# Patient Record
Sex: Female | Born: 1975 | Hispanic: No | Marital: Married | Smoking: Never smoker
Health system: Southern US, Community
[De-identification: ages and names within clinical notes are randomized; demographics above are authoritative.]

## PROBLEM LIST (undated history)

## (undated) HISTORY — PX: CYST EXCISION: SHX5701

---

## 2005-09-11 ENCOUNTER — Ambulatory Visit (HOSPITAL_COMMUNITY): Admission: RE | Admit: 2005-09-11 | Discharge: 2005-09-11 | Payer: Self-pay | Admitting: Obstetrics & Gynecology

## 2005-12-16 ENCOUNTER — Inpatient Hospital Stay (HOSPITAL_COMMUNITY): Admission: AD | Admit: 2005-12-16 | Discharge: 2005-12-23 | Payer: Self-pay | Admitting: Obstetrics & Gynecology

## 2005-12-29 ENCOUNTER — Inpatient Hospital Stay (HOSPITAL_COMMUNITY): Admission: AD | Admit: 2005-12-29 | Discharge: 2006-01-15 | Payer: Self-pay | Admitting: Obstetrics & Gynecology

## 2006-01-13 ENCOUNTER — Encounter (INDEPENDENT_AMBULATORY_CARE_PROVIDER_SITE_OTHER): Payer: Self-pay | Admitting: *Deleted

## 2006-10-18 IMAGING — US US OB TRANSVAGINAL MODIFY
1 series · 13 of 28 positions shown · non-contrast
Comparison: none

CLINICAL DATA: Preterm labor.

[Series 1: us ob transvaginal modify · 0.35mm/px · 13 of 38 slices shown]
[im 2/38]
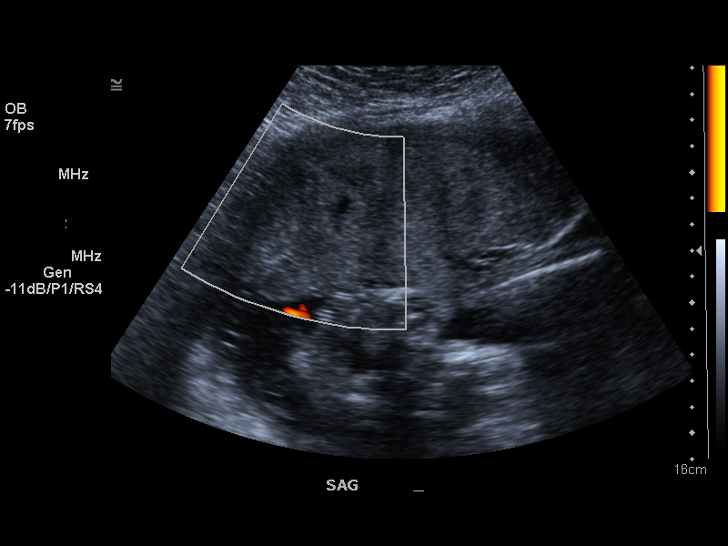
[im 5/38]
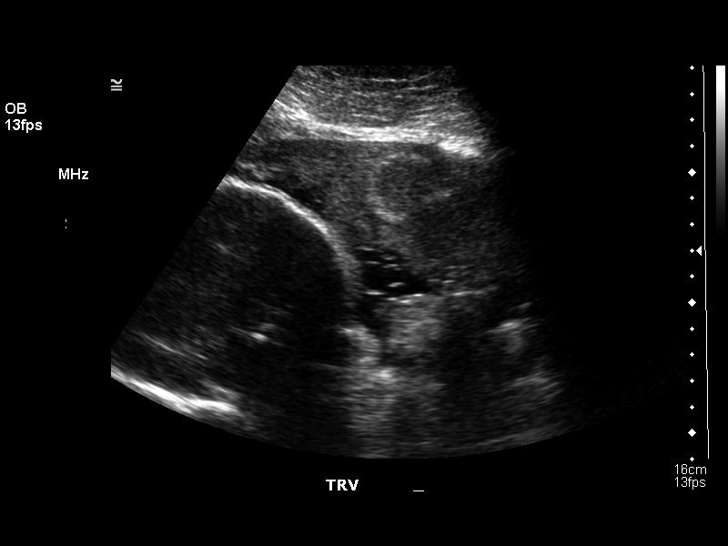
[im 7/38]
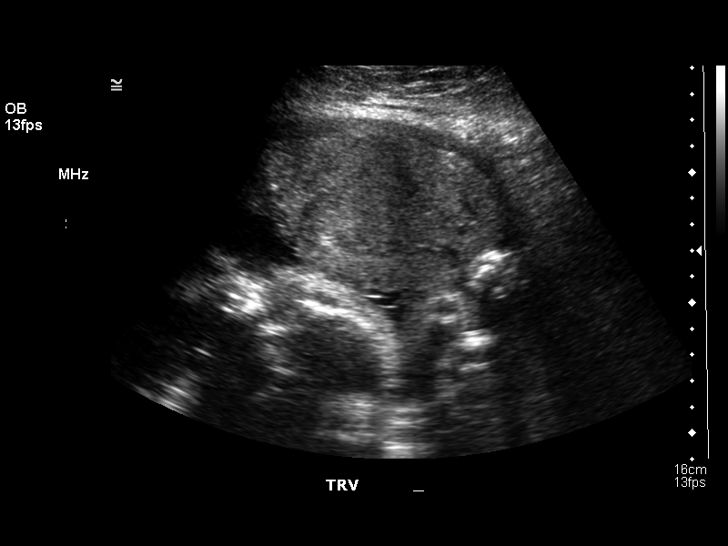
[im 10/38]
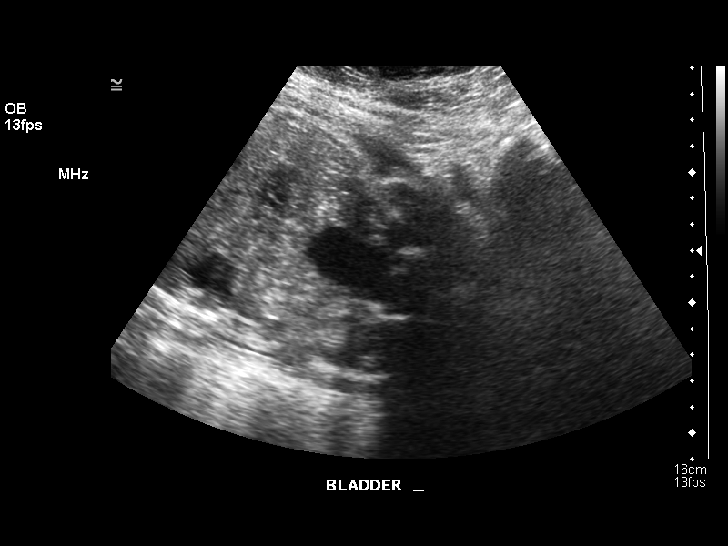
[im 13/38]
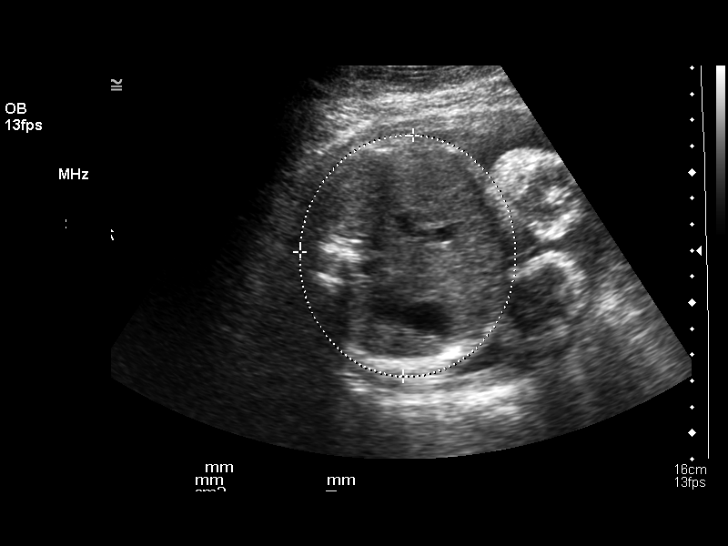
[im 16/38]
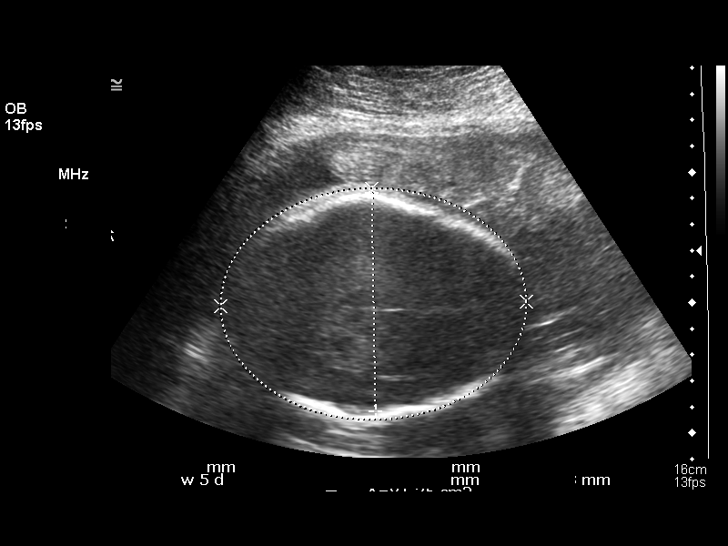
[im 20/38]
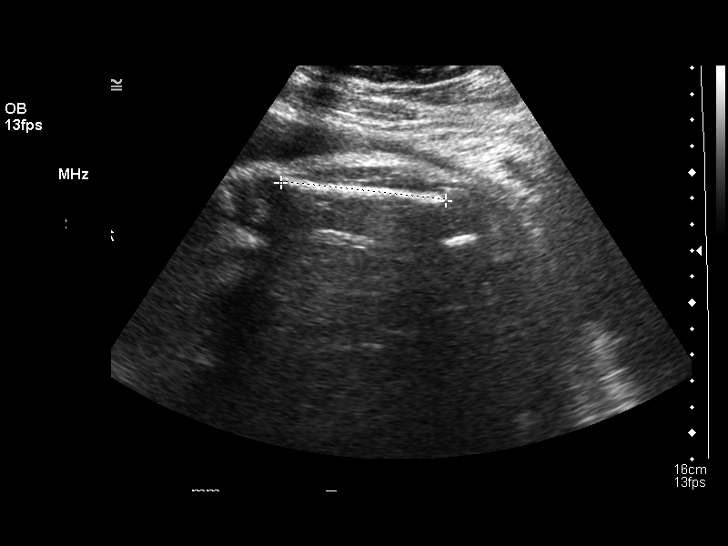
[im 22/38]
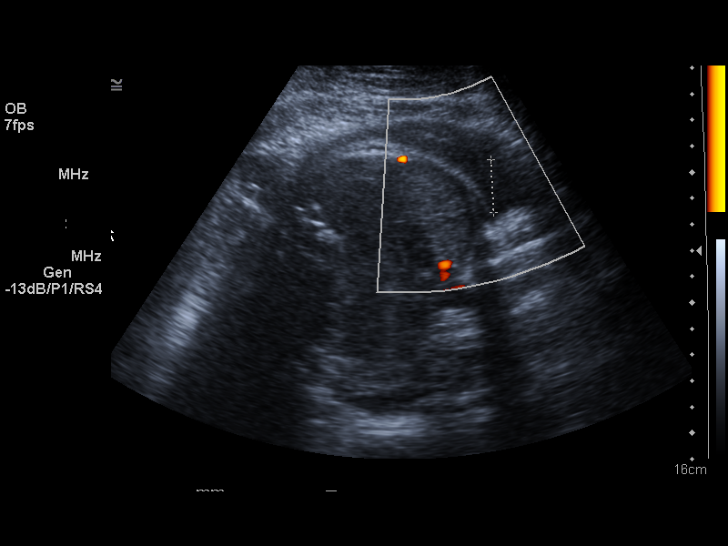
[im 25/38]
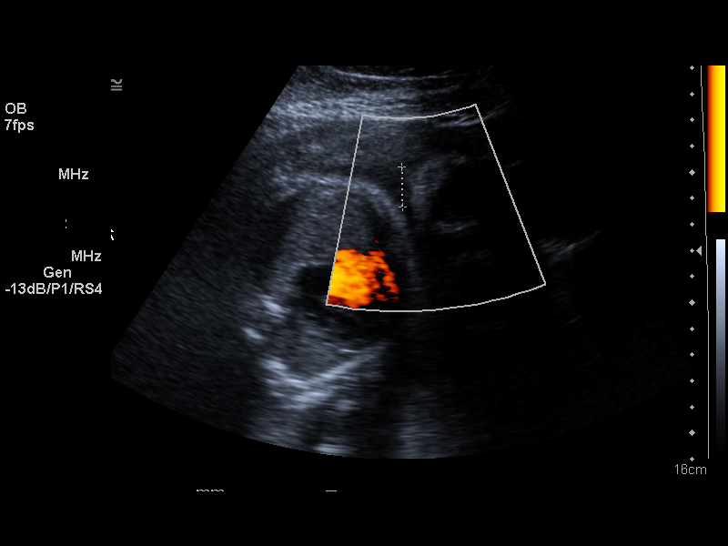
[im 28/38]
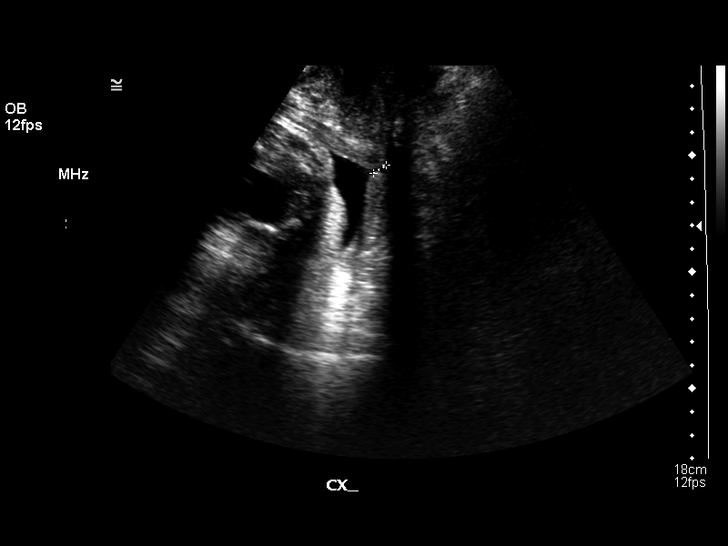
[im 31/38]
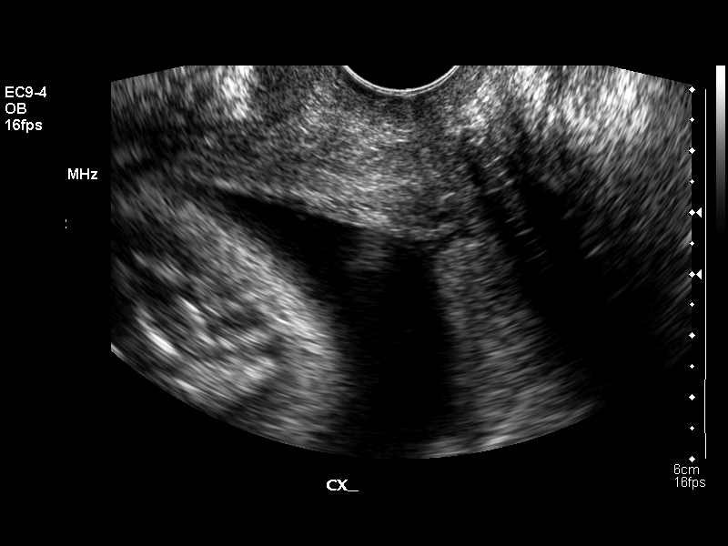
[im 33/38]
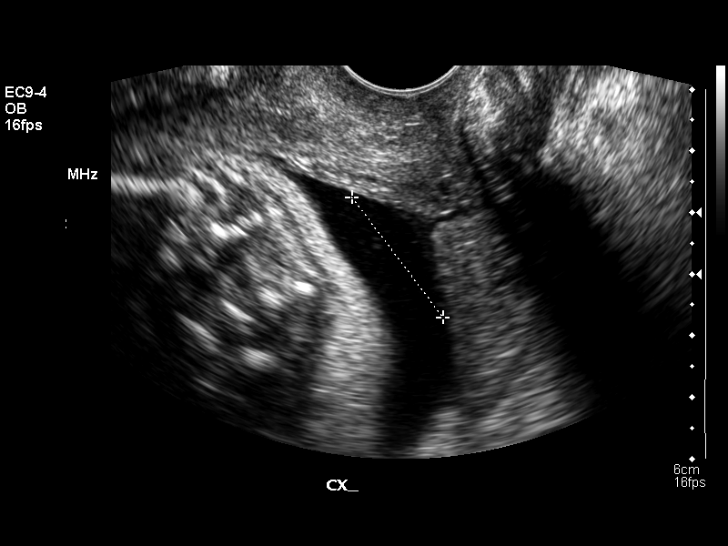
[im 36/38]
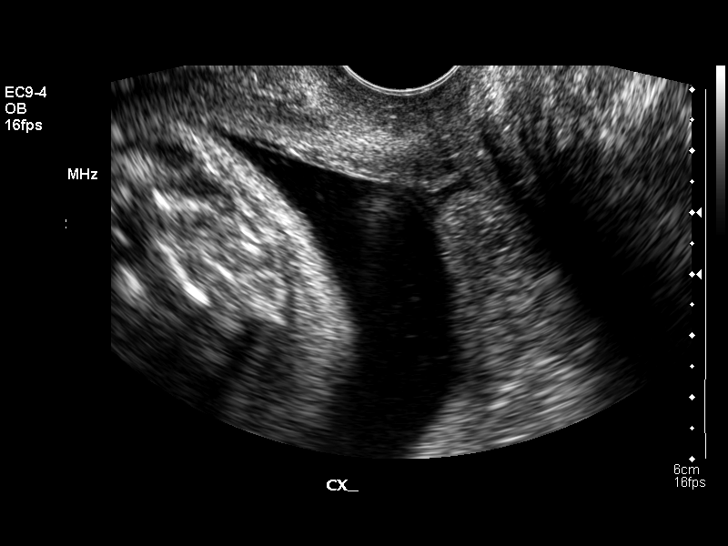

[13 of 28 positions shown; findings below may reference images not displayed]

OBSTETRICAL ULTRASOUND RE-EVALUATION AND TRANSVAGINAL OB US:
 Number of Fetuses: 1
 Heart Rate:  128
 Movement:  Yes
 Breathing:  No
 Presentation:  Frank breech
 Placental Location:  Anterior
 Grade:  II
 Previa:  No
 Amniotic Fluid (subjective):  Normal
 Amniotic Fluid (objective):  9.4 cm AFI (5th -95th%ile = 8.6 – 24.2 cm for 32 wks)

 FETAL BIOMETRY
 BPD:  8.4 cm  33 w 5 d
 HC:  32.2 cm  36 w 2 d
 AC:  27.6 cm  31 w 4 d
 FL:   6.5 cm   33 w 2 d

 Mean GA:  33 w 5 d  US EDC:  01/29/06
 Assigned GA:  32 w 2 d  Assigned EDC:  02/08/06

 EFW:  7118 g (H) 75th – 90th%ile (2544 – 8852 g) For 32 wks

 FETAL ANATOMY
 Lateral Ventricles:  Previously seen 
 Thalami/CSP:  Previously seen 
 Posterior Fossa:  Previously seen 
 Nuchal Region:  Previously seen 
 Spine:  Previously seen 
 4 Chamber Heart on Left:  Previously seen 
 Stomach on Left:  Visualized 
 3 Vessel Cord:  Previously seen 
 Cord Insertion Site:  Previously seen 
 Kidneys:  Visualized 
 Bladder:  Visualized 
 Extremities:  Previously seen 

 ADDITIONAL ANATOMY VISUALIZED:  Male genitalia.

 MATERNAL UTERINE AND ADNEXAL FINDINGS
 Cervix: 0 cm Transvaginally
IMPRESSION: 1.  Single live intrauterine gestation in breech presentation with measurements corresponding to 33 weeks 5 days estimated gestational age.  
 2.  Limited morphologic survey due to advanced gestational age.
 3.  Anterior placenta without previa.  
 4.  Normal amniotic fluid volume, 9.4 cm AFI.
 5.  Cervix appears nearly completely effaced, 6 to 7 mm in length with funneling and extension of fluid into the os.

## 2006-11-14 IMAGING — US US OB LIMITED
1 series · 14 of 22 positions shown · non-contrast
Comparison: none

CLINICAL DATA: 34 weeks estimated gestational age.  Preterm labor.  Assess fetal positioning.

[Series 1: us ob limited · 0.39mm/px · 14 of 22 slices shown]
[im 1/22]
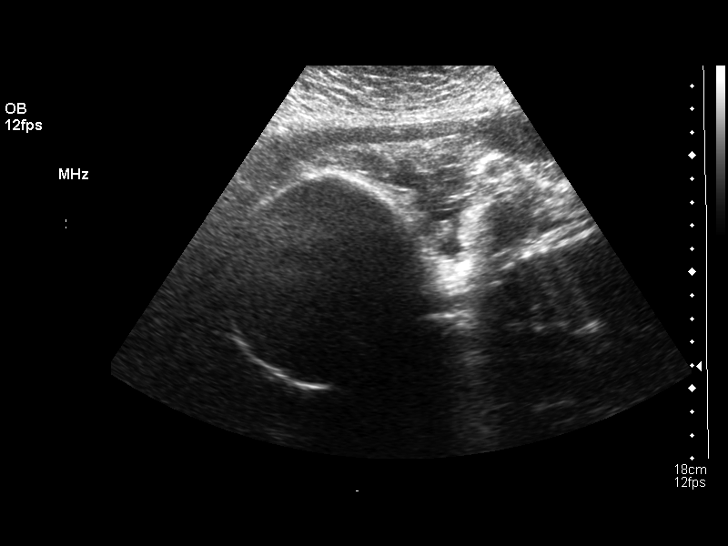
[im 3/22]
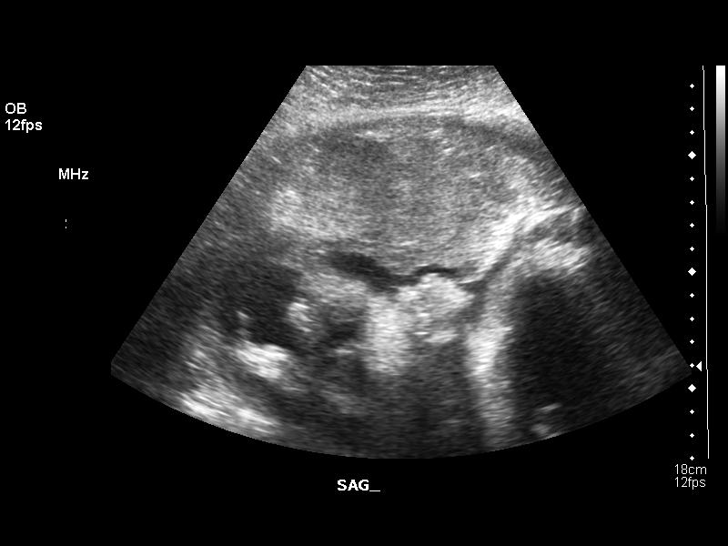
[im 4/22]
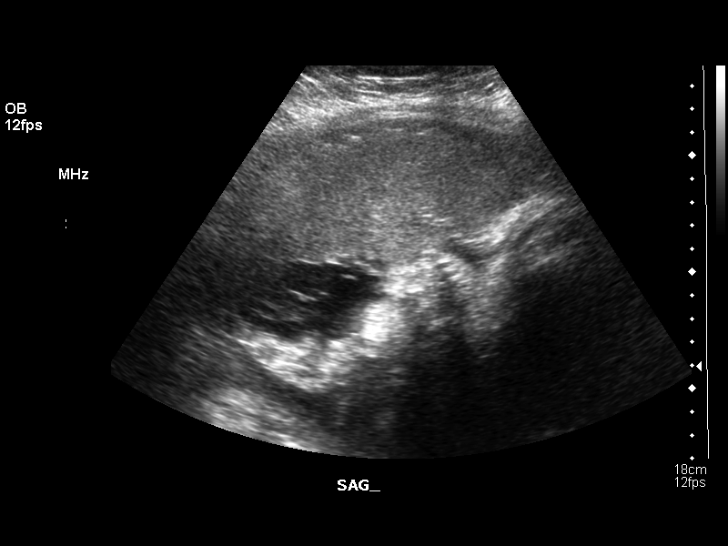
[im 6/22]
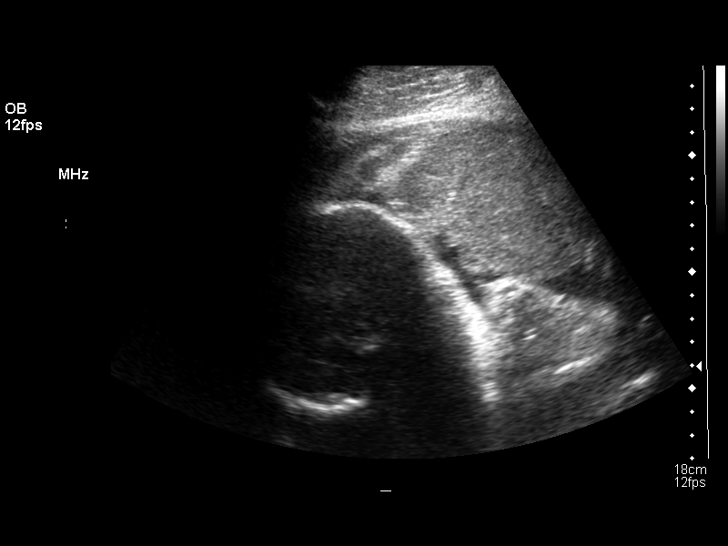
[im 8/22]
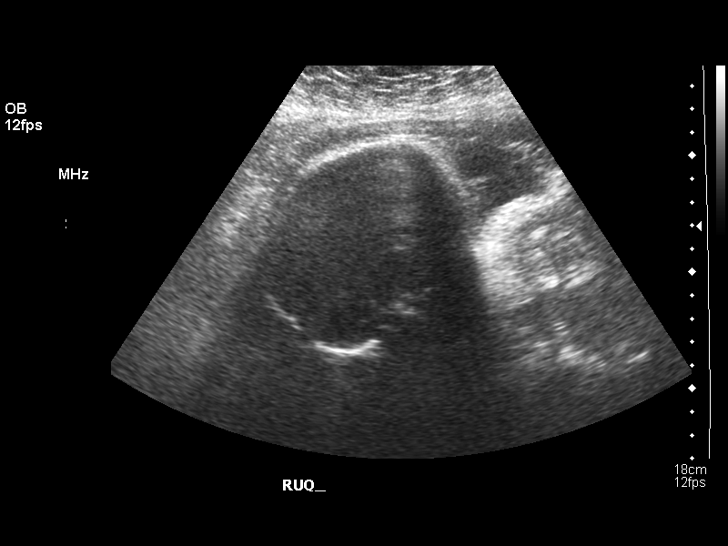
[im 9/22]
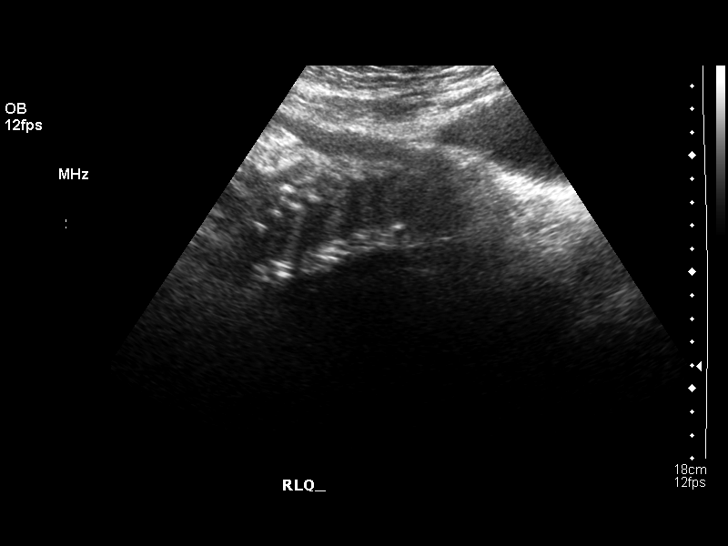
[im 11/22]
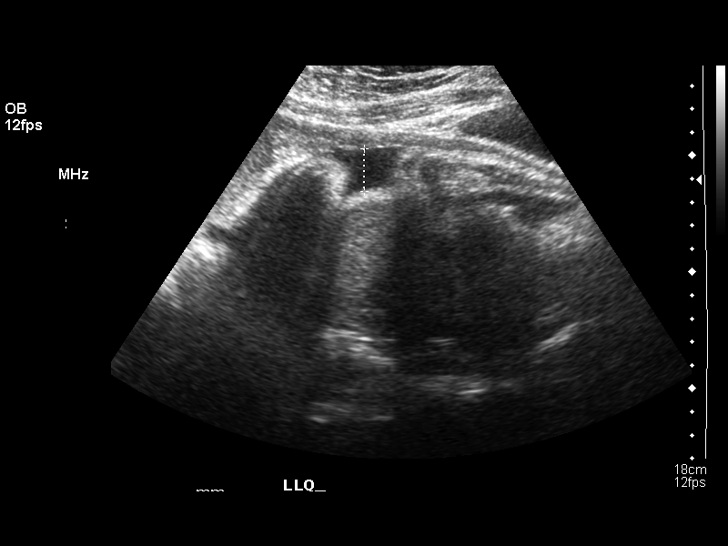
[im 12/22]
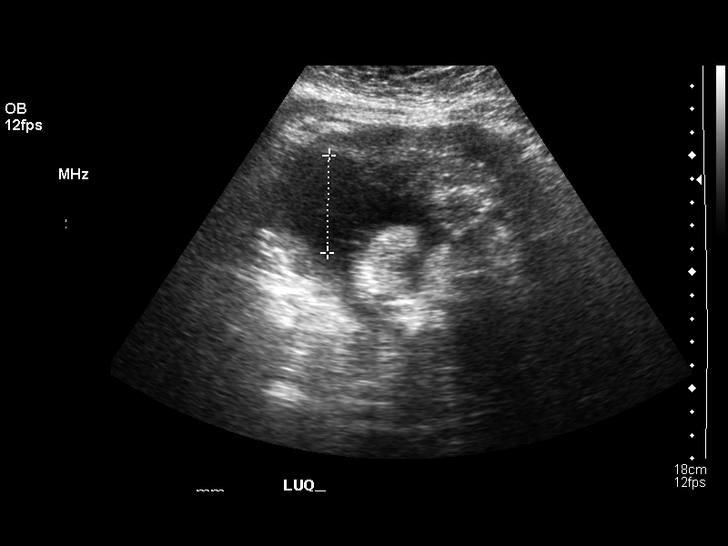
[im 14/22]
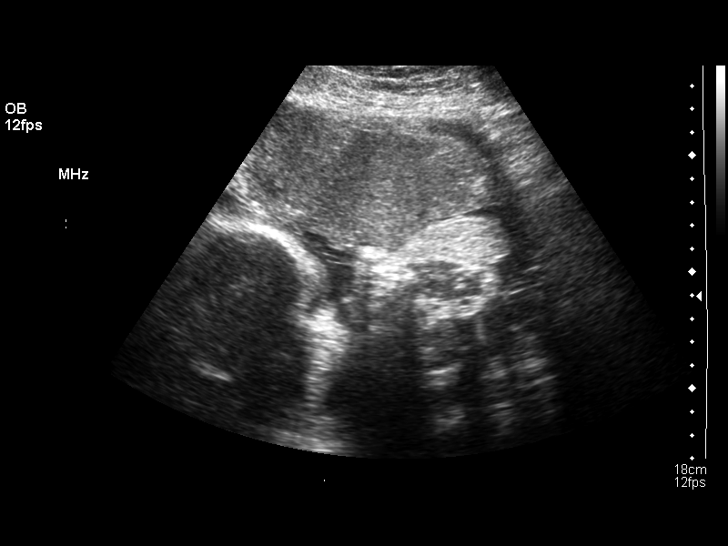
[im 15/22]
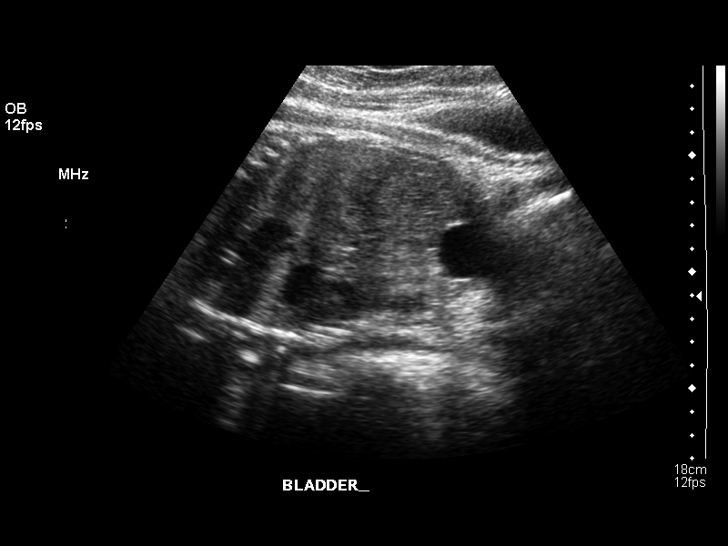
[im 17/22]
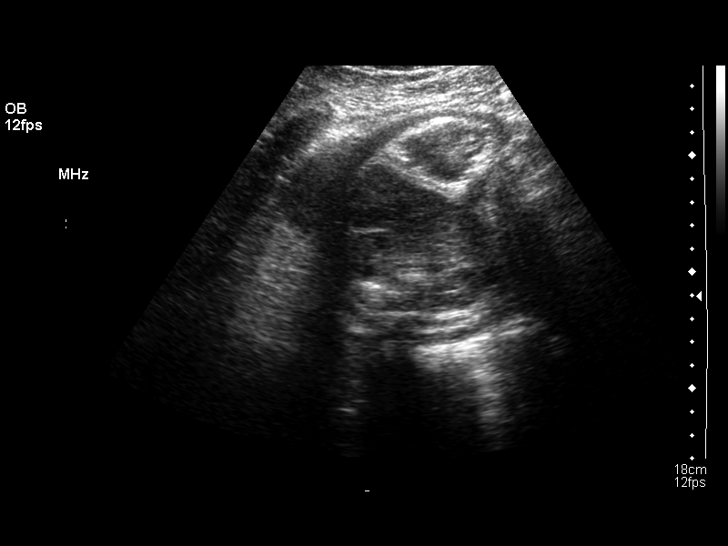
[im 19/22]
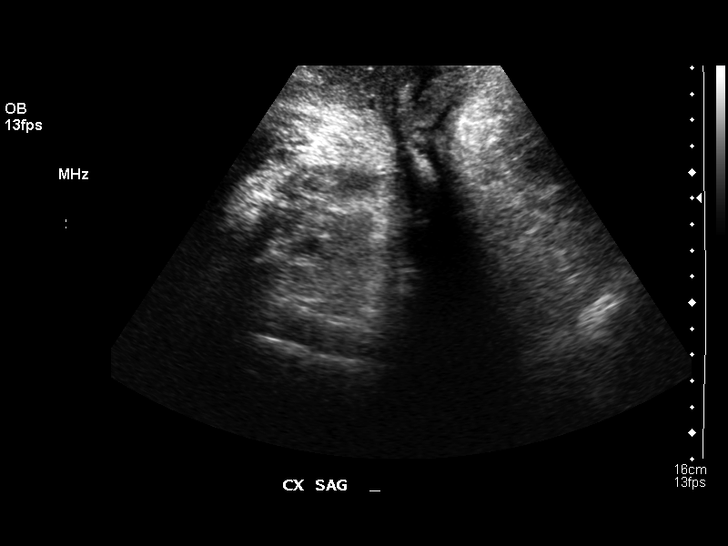
[im 20/22]
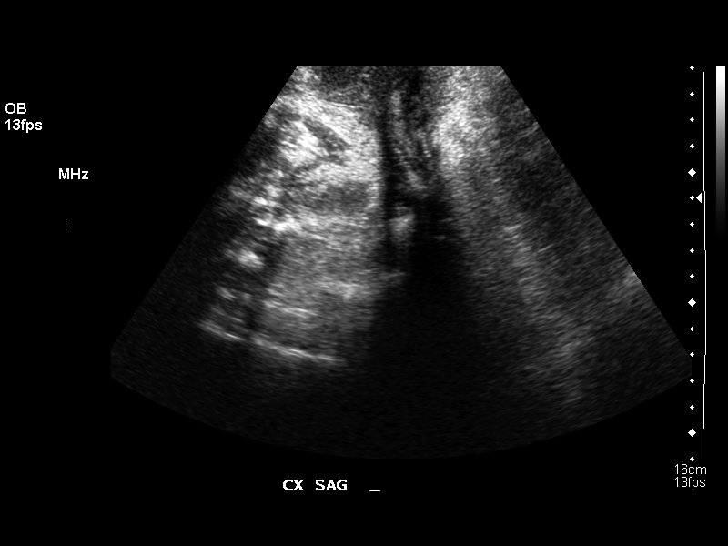
[im 22/22]
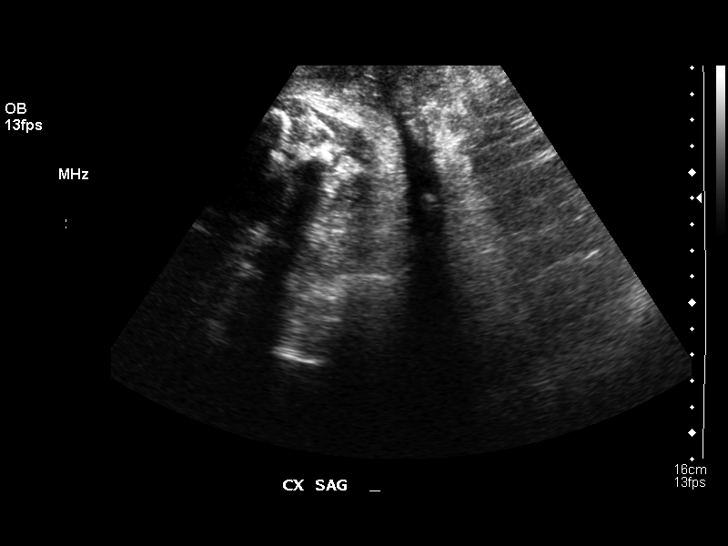

[14 of 22 positions shown; findings below may reference images not displayed]

LIMITED OBSTETRICAL ULTRASOUND:
Number of Fetuses:  1
Heart Rate: 128 
Movement:  Yes
Breathing:  No
Presentation:  Frank breech
Placental Location:  Anterior
Grade:  II
Previa:  No
Amniotic Fluid (Subjective):  Decreased
Amniotic Fluid (Objective):  6.0 cm AFI (5th -95th%ile = 7.7 ? 24.9 cm for 36 wks)

Fetal measurements and complete anatomic evaluation were not requested.  The following fetal anatomy was visualized during this exam:  Stomach, kidneys, and bladder. 

MATERNAL UTERINE AND ADNEXAL FINDINGS
Cervix:  No measurable cervix.
IMPRESSION: 1.  Current frank breech positioning.
2.  No measurable cervix noted.
3.  Subjectively and quantitatively decreased amniotic fluid volume with an amniotic fluid index today measuring 6.0 cm (7.7 ? 24.9 cm).
4.  No late developing fetal anatomic abnormalities are identified associated with the stomach, kidneys, or bladder.  A four chamber heart and lateral ventricles could not be assessed due to positioning on today?s exam.

## 2010-02-04 ENCOUNTER — Other Ambulatory Visit: Admission: RE | Admit: 2010-02-04 | Discharge: 2010-02-04 | Payer: Self-pay | Admitting: Family Medicine

## 2011-02-21 NOTE — Discharge Summary (Signed)
NAMEJENIYA, [name]            ACCOUNT NO.:  [id number]   MEDICAL RECORD NO.:  [id number]          PATIENT TYPE:  INP   LOCATION:  9106                          FACILITY:  [facility]   PHYSICIAN:  Charles A. Clearance Coots, M.D.DATE OF BIRTH:  01-Nov-1975   DATE OF ADMISSION:  12/29/2005  DATE OF DISCHARGE:  01/15/2006                                 DISCHARGE SUMMARY   ADMISSION DIAGNOSES:  A 34-week gestation, uterine contractions, breech  presentation.   DISCHARGE DIAGNOSES:  A 34-week gestation, uterine contractions, breech  presentation, status post primary low transverse cesarean section for  preterm labor, breech presentation.  Delivery of a viable female fetus on  January 12, 2006 by low transverse cesarean section at 1341.  Apgars were 9 at  one minute, 9 at five minutes.  Weight was 2440 gm.  Length was 48 cm.  Mother and infant discharged home in good condition.   REASON FOR ADMISSION:  A 35 year old gravida 4, para 0 with an estimated  date of confinement of Feb 08, 2006.  Presented to the office with a  complaint of uterine contractions.  The patient was recently hospitalized  approximately two weeks prior to admission with similar complaints.  She  denied ruptured membranes or vaginal bleeding.   PAST MEDICAL HISTORY:  1.  Surgery:  Cyst removed from right arm.  2.  Illnesses:  None.   MEDICATIONS:  Prenatal vitamins.   ALLERGIES:  No known drug allergies.   SOCIAL HISTORY:  Married.  Occupation:  Research scientist (life sciences).  She lives with her  spouse.  She denies any tobacco, alcohol, or recreational drug use.   FAMILY HISTORY:  Significant for colon cancer and adult-onset diabetes  mellitus.  Hypertension.   PHYSICAL EXAMINATION:  VITAL SIGNS:  Blood pressure 112/80, temperature  98.2, pulse 114.  GENERAL:  A well-developed and well-nourished female in no acute distress.  ABDOMEN:  Gravid.  Nontender.  PELVIC:  Thorough vaginal exam:  The cervix is 2 cm dilated and 80% effaced.  The  presenting part is low.  There was significant lower uterine segment  development.   IMPRESSION:  Intrauterine pregnancy [redacted] weeks gestation, rule out preterm  labor.  Breech presentation.   ADMITTING LABORATORY VALUES:  Hemoglobin 13.6, hematocrit 40, white blood  cell count 13,000, platelets 219,000.   HOSPITAL COURSE:  The patient was admitted and placed on bedrest.  Did  reasonably well on bedrest until the day of delivery when she had increased  uterine discomfort and spontaneous rupture of membranes.  Patient was taken  for a cesarean section because of the breech presentation.  Primary low  transverse cesarean section was performed without complications on January 12, 2006.  Postoperative course was uncomplicated, and the patient was  discharged home on postop day #3 in good condition.   DISCHARGE LABORATORY VALUES:  Hemoglobin 10, hematocrit 29, white blood cell  count 10,000, platelets 150,000.   DISCHARGE DISPOSITION:  1.  Medications:  Tylox and ibuprofen were prescribed for pain.  Continue      prenatal vitamins.  2.  Routine written instructions for a booklet were given  for discharge      after cesarean section.  3.  Patient is to call the office for a follow-up appointment in two weeks.      Charles A. Clearance Coots, M.D.  Electronically Signed     CAH/MEDQ  D:  01/15/2006  T:  01/15/2006  Job:  [id number]

## 2011-02-21 NOTE — H&P (Signed)
NAMEALEZA, PEW NO.:  [id number]   MEDICAL RECORD NO.:  [id number]          PATIENT TYPE:  INP   LOCATION:  9157                          FACILITY:  [facility]   PHYSICIAN:  Roseanna Rainbow, M.D.DATE OF BIRTH:  02-02-1976   DATE OF ADMISSION:  12/16/2005  DATE OF DISCHARGE:                                HISTORY & PHYSICAL   CHIEF COMPLAINT:  The patient is a 35 year old gravida 4, para 0 with an  estimated date of confinement of Feb 08, 2006 with an intrauterine pregnancy  at 32+ weeks, complaining of contractions.   HISTORY OF PRESENT ILLNESS:  The patient reports onset of lower abdominal  cramping for approximately 12 hours prior to presentation. She has no  significant concomitant complaints.   ALLERGIES:  No known drug allergies.   MEDICATIONS:  Prenatal vitamins.   RISK FACTORS:  None.   LABORATORY DATA:  Hemoglobin 13.2, hematocrit 38.8, platelets 212,000. One  hour GCT 117. Blood type O positive, antibody screen negative. Rubella  immune. Ultrasound on September 11, 2005 at 19 weeks, 4 days: anterior  placenta no previa, amniotic fluid normal.   PAST GYN HISTORY:  D&C. History of normal Pap smears.   PAST MEDICAL HISTORY:  No significant history of medical diseases.   PAST SURGICAL HISTORY:  Cyst removed from right arm.   SOCIAL HISTORY:  Occupation Personnel officer. She is married, living with  spouse. Denies any alcohol usage. Has no significant smoking history. Denies  illicit drug use.   FAMILY HISTORY:  Colon cancer. Adult onset diabetes. Hypertension.   PHYSICAL EXAMINATION:  VITAL SIGNS:  Stable, afebrile. Blood pressure  130/80.  GENERAL:  Slightly uncomfortable.  ABDOMEN:  The fundal height is 32+weeks, fundus is slightly firm with  contractions. Fetal Doppler is 150's.  SPECULUM EXAM:  Minimal white discharge. The cervix appears closed. Fetal  fibronectin, wet prep, GC/Chlamydia were sent. Bimanual exam - the cervix is  mid  position, the external os is 1 cm dilated.  The cervix is about 50% effaced. On an informal transvaginal ultrasound the  cervix is approximately 1 cm long, breech presentation noted.   ASSESSMENT:  Intrauterine pregnancy at 32+ weeks with rule out threatened  preterm labor, borderline blood pressure elevations.   PLAN:  Admission, tocolysis,  beta methasone, PIH panel, bed rest.      Roseanna Rainbow, M.D.  Electronically Signed     LAJ/MEDQ  D:  12/16/2005  T:  12/16/2005  Job:  [id number]

## 2011-02-21 NOTE — H&P (Signed)
NAMERAHCEL, SHUTES NO.:  [id number]   MEDICAL RECORD NO.:  [id number]          PATIENT TYPE:  INP   LOCATION:  9157                          FACILITY:  [facility]   PHYSICIAN:  Roseanna Rainbow, M.D.DATE OF BIRTH:  19-Nov-1975   DATE OF ADMISSION:  12/29/2005  DATE OF DISCHARGE:                                HISTORY & PHYSICAL   CHIEF COMPLAINT:  The patient is a 35 year old, gravida 4, para 0, with an  estimated date of confinement of May 6 with an intrauterine pregnancy at 34+  weeks complaining of uterine contractions.   HISTORY OF PRESENT ILLNESS:  Please see the above.  She was recently  hospitalized approximately two weeks ago with similar complaints.  She  denies rupture of membranes or vaginal bleeding.   ALLERGIES:  No known drug allergies.   MEDICATIONS:  Prenatal vitamins.   RISK FACTORS:  Please see the above.   LABORATORY DATA:  Hemoglobin 13.2, hematocrit 38.8, platelets 212,000.  One-  hour GTT 117.  Blood type O positive, antibody screen negative.  Rubella  immune.  Ultrasound on March 13 showed frank breech presentation, anterior  placenta, no previa.  Amniotic fluid 9.4.  On repeat on March 16, this was  stable.  Estimated fetal weight percentile was 75th to 90th percentile for  32 weeks.  Cervix was completely  effaced, 6-7 mm in length with funneling.   GYN HISTORY:  1.  Dilatation and curettage.  2.  History of normal Pap smears.   PAST MEDICAL HISTORY:  No significant history of medical diseases.   PAST SURGICAL HISTORY:  Cyst removed from the right arm.   SOCIAL HISTORY:  Occupation:  Research scientist (life sciences).  She is married, living with her  spouse.  Denies any alcohol usage.  Has no significant smoking history.  Denies illicit drug use.   FAMILY HISTORY:  Colon cancer.  Adult onset diabetes mellitus.  Hypertension.   PHYSICAL EXAMINATION:  VITAL SIGNS:  Blood pressure 112/80, temperature  98.2, pulse 114.  GENERAL:  Well-developed, in  no apparent distress.  ABDOMEN:  Gravid, breech by Leopold's.  PELVIC:  Sterile vaginal exam, the cervix is 2 cm dilated and 80% effaced.  The presenting part is low.  Significant lower uterine segment development.   ASSESSMENT:  Intrauterine pregnancy at 34+ weeks, rule out preterm labor,  breech presentation.   PLAN:  1.  Admission.  2.  Subcutaneous terbutaline for tocolysis.  3.  Bed rest.      Roseanna Rainbow, M.D.  Electronically Signed     LAJ/MEDQ  D:  12/29/2005  T:  12/30/2005  Job:  [id number]

## 2011-02-21 NOTE — Discharge Summary (Signed)
[name], PODGORSKI NO.:  [id number]   MEDICAL RECORD NO.:  [id number]          PATIENT TYPE:  INP   LOCATION:  9157                          FACILITY:  [facility]   PHYSICIAN:  Roseanna Rainbow, M.D.DATE OF BIRTH:  May 13, 1976   DATE OF ADMISSION:  12/16/2005  DATE OF DISCHARGE:  12/23/2005                                 DISCHARGE SUMMARY   CHIEF COMPLAINT:  The patient is a 35 year old gravida 4, para 0 with an  estimated date of confinement of Feb 08, 2006 with an intrauterine pregnancy  at 32+ weeks complaining of uterine contractions.  Please see the dictated  history and physical for further details.   HOSPITAL COURSE:  The patient was admitted and was given subcutaneous  terbutaline for tocolysis.  She also received a course of betamethasone.  She was easily tocolysed with the subcutaneous terbutaline.  Of note, a  fetal fibronectin and a wet prep in the office remarkable for the fetal  fibronectin being positive and the wet prep was consistent with bacterial  vaginosis.  At this point she was started on intravenous clindamycin.  Her  blood pressures were noted to be fairly labile as well in the 120s-140s/60s-  80s.  However, she was not proteinuric.  A PIH panel was not consistent with  HELLP syndrome.  During her hospital stay the tocodynamometer had episodes  of increased uterine activity.  However, these periods were unsustained.  An  ultrasound on March 13 demonstrated frank breech presentation, no previa,  normal amniotic fluid with amniotic fluid index of 9.4, estimated fetal  weight percentile was 75th-90th percentile.  The cervix was 6-7 mm in length  with funneling.  A sterile vaginal examination on the date of discharge was  without change from the examination on admission.   DISCHARGE DIAGNOSES:  1.  Intrauterine pregnancy at 33+ weeks.  2.  Threatened preterm labor.   CONDITION ON DISCHARGE:  Stable.   DIET:  Regular.   ACTIVITY:  Bed  rest.  Pelvic rest.   MEDICATIONS:  Resume home medications.   DISPOSITION:  The patient was to follow up in the office in one week.      Roseanna Rainbow, M.D.  Electronically Signed     LAJ/MEDQ  D:  12/23/2005  T:  12/23/2005  Job:  [id number]

## 2011-02-21 NOTE — Op Note (Signed)
[name], [name]            ACCOUNT NO.:  [id number]   MEDICAL RECORD NO.:  [id number]          PATIENT TYPE:  INP   LOCATION:  9106                          FACILITY:  [facility]   PHYSICIAN:  Roseanna Rainbow, M.D.DATE OF BIRTH:  06-22-1976   DATE OF PROCEDURE:  01/12/2006  DATE OF DISCHARGE:                                 OPERATIVE REPORT   PREOPERATIVE DIAGNOSES:  1.  Intrauterine pregnancy at 36 weeks.  2.  Breech presentation.  3.  Active labor.   POSTOPERATIVE DIAGNOSES:  1.  Intrauterine pregnancy at 36 weeks.  2.  Breech presentation.  3.  Active labor.   PROCEDURE:  Primary low uterine flap elliptical cesarean delivery via  Pfannenstiel skin incision.   SURGEON:  Roseanna Rainbow, M.D.   ANESTHESIA:  Spinal.   ESTIMATED BLOOD LOSS:  600 mL.   IV FLUIDS AND URINE OUTPUT:  As per anesthesiology.   COMPLICATIONS:  None.   PROCEDURE:  The patient was taken to the operating room.  A spinal  anesthetic was placed without difficulty.  She was placed in the dorsal  supine position with leftward tilt and prepped and draped in usual sterile  fashion.  A Pfannenstiel skin incision was then made with a scalpel and  carried down to the underlying fascia.  The fascia was nicked in the  midline.  Fascial incision was then extended bilaterally with curved Mayo  scissors.  The superior aspect of the fascial incision was tented up and the  underlying rectus muscles dissected off.  The inferior aspect of the fascial  incision was manipulated in a similar fashion.  The rectus muscles were  separated in the midline.  The parietal peritoneum was tented up and entered  sharply with Metzenbaum scissors.  This incision was then extended  superiorly and inferiorly with good visualization of the bladder.  The  bladder blade was then placed.  The vesicouterine peritoneum was tented up  and entered sharply with Metzenbaum scissors.  This incision was then  extended bilaterally  and the bladder flap created bluntly.  The bladder  blade was then replaced.  The lower uterine segment was incised in a  transverse fashion with the scalpel.  The uterine incision was then extended  bilaterally with bandage scissors.  The complete breech extraction was then  performed.  The infant's head was delivered atraumatically.  The oropharynx  was suctioned with bulb suction.  The cord was clamped and cut.  The infant  was handed off to the awaiting neonatologist.  Apgars were 9.  The placenta  was then removed.  The intrauterine cavity was evacuated of any remaining  amniotic fluid, clots and debris with moist laparotomy sponge.  The uterine  incision was then reapproximated in a running interlocking fashion using 0  Monocryl.  A second imbricating layer was then placed using the same suture.  Adequate hemostasis was noted.  The paracolic gutters were then copiously  irrigated.  The parietal peritoneum was reapproximated in a running fashion  with 2-0 Vicryl.  Fascia was reapproximated in a  running fashion with 0 Vicryl.  The skin  was reapproximated with staples.  At the close of the procedure, the instrument and pad counts were said to be  correct x2.  Cephazolin 1 g was given at cord clamp.  The patient was taken  to the PACU awake and in stable condition.      Roseanna Rainbow, M.D.  Electronically Signed     LAJ/MEDQ  D:  01/12/2006  T:  01/13/2006  Job:  [id number]

## 2013-08-02 ENCOUNTER — Other Ambulatory Visit (HOSPITAL_COMMUNITY)
Admission: RE | Admit: 2013-08-02 | Discharge: 2013-08-02 | Disposition: A | Discharge status: Home / Self Care | Payer: BC Managed Care – PPO | Source: Ambulatory Visit | Attending: Family Medicine | Admitting: Family Medicine

## 2013-08-02 ENCOUNTER — Other Ambulatory Visit: Payer: Self-pay | Admitting: Family Medicine

## 2013-08-02 DIAGNOSIS — Z1151 Encounter for screening for human papillomavirus (HPV): Secondary | ICD-10-CM | POA: Insufficient documentation

## 2013-08-02 DIAGNOSIS — Z01419 Encounter for gynecological examination (general) (routine) without abnormal findings: Secondary | ICD-10-CM | POA: Insufficient documentation

## 2013-08-18 ENCOUNTER — Encounter: Payer: Self-pay | Admitting: Obstetrics & Gynecology

## 2013-09-08 ENCOUNTER — Ambulatory Visit (INDEPENDENT_AMBULATORY_CARE_PROVIDER_SITE_OTHER): Payer: BC Managed Care – PPO | Admitting: Obstetrics & Gynecology

## 2013-09-08 ENCOUNTER — Encounter: Payer: Self-pay | Admitting: Obstetrics & Gynecology

## 2013-09-08 VITALS — BP 119/82 | Temp 98.4°F | Wt 224.0 lb

## 2013-09-08 DIAGNOSIS — Z3201 Encounter for pregnancy test, result positive: Secondary | ICD-10-CM

## 2013-09-08 DIAGNOSIS — O34219 Maternal care for unspecified type scar from previous cesarean delivery: Secondary | ICD-10-CM

## 2013-09-08 DIAGNOSIS — O99210 Obesity complicating pregnancy, unspecified trimester: Secondary | ICD-10-CM | POA: Insufficient documentation

## 2013-09-08 DIAGNOSIS — Z3480 Encounter for supervision of other normal pregnancy, unspecified trimester: Secondary | ICD-10-CM | POA: Insufficient documentation

## 2013-09-08 DIAGNOSIS — Z3481 Encounter for supervision of other normal pregnancy, first trimester: Secondary | ICD-10-CM

## 2013-09-08 DIAGNOSIS — Z3482 Encounter for supervision of other normal pregnancy, second trimester: Secondary | ICD-10-CM

## 2013-09-08 DIAGNOSIS — O09522 Supervision of elderly multigravida, second trimester: Secondary | ICD-10-CM

## 2013-09-08 DIAGNOSIS — O09529 Supervision of elderly multigravida, unspecified trimester: Secondary | ICD-10-CM | POA: Insufficient documentation

## 2013-09-08 DIAGNOSIS — Z98891 History of uterine scar from previous surgery: Secondary | ICD-10-CM

## 2013-09-08 DIAGNOSIS — O99212 Obesity complicating pregnancy, second trimester: Secondary | ICD-10-CM

## 2013-09-08 LAB — POCT URINALYSIS DIPSTICK
Glucose, UA: NEGATIVE
Leukocytes, UA: NEGATIVE
Nitrite, UA: NEGATIVE
Spec Grav, UA: 1.025
Urobilinogen, UA: NEGATIVE

## 2013-09-08 NOTE — Progress Notes (Signed)
Subjective:    [name] is being seen today for her first obstetrical visit.  This is not a planned pregnancy. She is at [redacted]w[redacted]d gestation. Her obstetrical history is significant for advanced maternal age.  Pregnancy history fully reviewed.  Menstrual History: OB History   Grav Para Term Preterm Abortions TAB SAB Ect Mult Living   4 1  1 2     1        Patient's last menstrual period was 06/02/2013.    The following portions of the patient's history were reviewed and updated as appropriate: allergies, current medications, past family history, past medical history, past social history, past surgical history and problem list.  Review of Systems Pertinent items are noted in HPI.    Objective:   General Appearance:    Alert, cooperative, no distress, appears stated age  Head:    Normocephalic, without obvious abnormality, atraumatic  Eyes:    PERRL, conjunctiva/corneas clear, EOM's intact, fundi    benign, both eyes  Ears:    Normal TM's and external ear canals, both ears  Nose:   Nares normal, septum midline, mucosa normal, no drainage    or sinus tenderness  Throat:   Lips, mucosa, and tongue normal; teeth and gums normal  Neck:   Supple, symmetrical, trachea midline, no adenopathy;    thyroid:  no enlargement/tenderness/nodules; no carotid   bruit or JVD  Back:     Symmetric, no curvature, ROM normal, no CVA tenderness  Lungs:     Clear to auscultation bilaterally, respirations unlabored  Chest Wall:    No tenderness or deformity   Heart:    Regular rate and rhythm, S1 and S2 normal, no murmur, rub   or gallop  Breast Exam:    No tenderness, masses, or nipple abnormality  Abdomen:     Soft, non-tender, bowel sounds active all four quadrants,    no masses, no organomegaly  Genitalia:    Normal female without lesion, discharge or tenderness; cx closed, scant heme in vaginal vault  Extremities:   Extremities normal, atraumatic, no cyanosis or edema  Pulses:   2+ and symmetric  all extremities  Skin:   Skin color, texture, turgor normal, no rashes or lesions  Lymph nodes:   Cervical, supraclavicular, and axillary nodes normal  Neurologic:   CNII-XII intact, normal strength, sensation and reflexes    throughout  Informal U/S by me w/cardiac activity Assessment:    Pregnancy at [redacted]w[redacted]d weeks    Plan:    Initial labs drawn. Prenatal vitamins.  Counseling provided regarding continued use of seat belts, cessation of alcohol consumption, smoking or use of illicit drugs; infection precautions i.e., influenza/TDAP immunizations, toxoplasmosis,CMV, parvovirus, listeria and varicella; workplace safety, exercise during pregnancy; routine dental care, safe medications, sexual activity, hot tubs, saunas, pools, travel, caffeine use, fish and methlymercury, potential toxins, hair treatments, varicose veins Weight gain recommendations per IOM guidelines reviewed: obese/BMI >30->gain  11 - 20 lbs Problem list reviewed and updated. NIPTdiscussed Role of ultrasound in pregnancy discussed; fetal survey Amniocentesis discussed VBAC consent form provided Follow up in 4 weeks. 50% of 20 min visit spent on counseling and coordination of care.

## 2013-09-10 NOTE — Patient Instructions (Signed)
Second Trimester of Pregnancy The second trimester is from week 13 through week 28, months 4 through 6. The second trimester is often a time when you feel your best. Your body has also adjusted to being pregnant, and you begin to feel better physically. Usually, morning sickness has lessened or quit completely, you may have more energy, and you may have an increase in appetite. The second trimester is also a time when the fetus is growing rapidly. At the end of the sixth month, the fetus is about 9 inches long and weighs about 1 pounds. You will likely begin to feel the baby move (quickening) between 18 and 20 weeks of the pregnancy. BODY CHANGES Your body goes through many changes during pregnancy. The changes vary from woman to woman.   Your weight will continue to increase. You will notice your lower abdomen bulging out.  You may begin to get stretch marks on your hips, abdomen, and breasts.  You may develop headaches that can be relieved by medicines approved by your caregiver.  You may urinate more often because the fetus is pressing on your bladder.  You may develop or continue to have heartburn as a result of your pregnancy.  You may develop constipation because certain hormones are causing the muscles that push waste through your intestines to slow down.  You may develop hemorrhoids or swollen, bulging veins (varicose veins).  You may have back pain because of the weight gain and pregnancy hormones relaxing your joints between the bones in your pelvis and as a result of a shift in weight and the muscles that support your balance.  Your breasts will continue to grow and be tender.  Your gums may bleed and may be sensitive to brushing and flossing.  Dark spots or blotches (chloasma, mask of pregnancy) may develop on your face. This will likely fade after the baby is born.  A dark line from your belly button to the pubic area (linea nigra) may appear. This will likely fade after  the baby is born. WHAT TO EXPECT AT YOUR PRENATAL VISITS During a routine prenatal visit:  You will be weighed to make sure you and the fetus are growing normally.  Your blood pressure will be taken.  Your abdomen will be measured to track your baby's growth.  The fetal heartbeat will be listened to.  Any test results from the previous visit will be discussed. Your caregiver may ask you:  How you are feeling.  If you are feeling the baby move.  If you have had any abnormal symptoms, such as leaking fluid, bleeding, severe headaches, or abdominal cramping.  If you have any questions. Other tests that may be performed during your second trimester include:  Blood tests that check for:  Low iron levels (anemia).  Gestational diabetes (between 24 and 28 weeks).  Rh antibodies.  Urine tests to check for infections, diabetes, or protein in the urine.  An ultrasound to confirm the proper growth and development of the baby.  An amniocentesis to check for possible genetic problems.  Fetal screens for spina bifida and Down syndrome. HOME CARE INSTRUCTIONS   Avoid all smoking, herbs, alcohol, and unprescribed drugs. These chemicals affect the formation and growth of the baby.  Follow your caregiver's instructions regarding medicine use. There are medicines that are either safe or unsafe to take during pregnancy.  Exercise only as directed by your caregiver. Experiencing uterine cramps is a good sign to stop exercising.  Continue to eat regular,   healthy meals.  Wear a good support bra for breast tenderness.  Do not use hot tubs, steam rooms, or saunas.  Wear your seat belt at all times when driving.  Avoid raw meat, uncooked cheese, cat litter boxes, and soil used by cats. These carry germs that can cause birth defects in the baby.  Take your prenatal vitamins.  Try taking a stool softener (if your caregiver approves) if you develop constipation. Eat more high-fiber  foods, such as fresh vegetables or fruit and whole grains. Drink plenty of fluids to keep your urine clear or pale yellow.  Take warm sitz baths to soothe any pain or discomfort caused by hemorrhoids. Use hemorrhoid cream if your caregiver approves.  If you develop varicose veins, wear support hose. Elevate your feet for 15 minutes, 3 4 times a day. Limit salt in your diet.  Avoid heavy lifting, wear low heel shoes, and practice good posture.  Rest with your legs elevated if you have leg cramps or low back pain.  Visit your dentist if you have not gone yet during your pregnancy. Use a soft toothbrush to brush your teeth and be gentle when you floss.  A sexual relationship may be continued unless your caregiver directs you otherwise.  Continue to go to all your prenatal visits as directed by your caregiver. SEEK MEDICAL CARE IF:   You have dizziness.  You have mild pelvic cramps, pelvic pressure, or nagging pain in the abdominal area.  You have persistent nausea, vomiting, or diarrhea.  You have a bad smelling vaginal discharge.  You have pain with urination. SEEK IMMEDIATE MEDICAL CARE IF:   You have a fever.  You are leaking fluid from your vagina.  You have spotting or bleeding from your vagina.  You have severe abdominal cramping or pain.  You have rapid weight gain or loss.  You have shortness of breath with chest pain.  You notice sudden or extreme swelling of your face, hands, ankles, feet, or legs.  You have not felt your baby move in over an hour.  You have severe headaches that do not go away with medicine.  You have vision changes. Document Released: 09/16/2001 Document Revised: 05/25/2013 Document Reviewed: 11/23/2012 [employer] Patient Information 2014 [employer].  Breastfeeding Deciding to breastfeed is one of the best choices you can make for you and your baby. A change in hormones during pregnancy causes your breast tissue to grow and increases the  number and size of your milk ducts. These hormones also allow proteins, sugars, and fats from your blood supply to make breast milk in your milk-producing glands. Hormones prevent breast milk from being released before your baby is born as well as prompt milk flow after birth. Once breastfeeding has begun, thoughts of your baby, as well as his or her sucking or crying, can stimulate the release of milk from your milk-producing glands.  BENEFITS OF BREASTFEEDING For Your Baby  Your first milk (colostrum) helps your baby's digestive system function better.   There are antibodies in your milk that help your baby fight off infections.   Your baby has a lower incidence of asthma, allergies, and sudden infant death syndrome.   The nutrients in breast milk are better for your baby than infant formulas and are designed uniquely for your baby's needs.   Breast milk improves your baby's brain development.   Your baby is less likely to develop other conditions, such as childhood obesity, asthma, or type 2 diabetes mellitus.  For   You   Breastfeeding helps to create a very special bond between you and your baby.   Breastfeeding is convenient. Breast milk is always available at the correct temperature and costs nothing.   Breastfeeding helps to burn calories and helps you lose the weight gained during pregnancy.   Breastfeeding makes your uterus contract to its prepregnancy size faster and slows bleeding (lochia) after you give birth.   Breastfeeding helps to lower your risk of developing type 2 diabetes mellitus, osteoporosis, and breast or ovarian cancer later in life. SIGNS THAT YOUR BABY IS HUNGRY Early Signs of Hunger  Increased alertness or activity.  Stretching.  Movement of the head from side to side.  Movement of the head and opening of the mouth when the corner of the mouth or cheek is stroked (rooting).  Increased sucking sounds, smacking lips, cooing, sighing, or  squeaking.  Hand-to-mouth movements.  Increased sucking of fingers or hands. Late Signs of Hunger  Fussing.  Intermittent crying. Extreme Signs of Hunger Signs of extreme hunger will require calming and consoling before your baby will be able to breastfeed successfully. Do not wait for the following signs of extreme hunger to occur before you initiate breastfeeding:   Restlessness.  A loud, strong cry.   Screaming. BREASTFEEDING BASICS Breastfeeding Initiation  Find a comfortable place to sit or lie down, with your neck and back well supported.  Place a pillow or rolled up blanket under your baby to bring him or her to the level of your breast (if you are seated). Nursing pillows are specially designed to help support your arms and your baby while you breastfeed.  Make sure that your baby's abdomen is facing your abdomen.   Gently massage your breast. With your fingertips, massage from your chest wall toward your nipple in a circular motion. This encourages milk flow. You may need to continue this action during the feeding if your milk flows slowly.  Support your breast with 4 fingers underneath and your thumb above your nipple. Make sure your fingers are well away from your nipple and your baby's mouth.   Stroke your baby's lips gently with your finger or nipple.   When your baby's mouth is open wide enough, quickly bring your baby to your breast, placing your entire nipple and as much of the colored area around your nipple (areola) as possible into your baby's mouth.   More areola should be visible above your baby's upper lip than below the lower lip.   Your baby's tongue should be between his or her lower gum and your breast.   Ensure that your baby's mouth is correctly positioned around your nipple (latched). Your baby's lips should create a seal on your breast and be turned out (everted).  It is common for your baby to suck about 2 3 minutes in order to start the  flow of breast milk. Latching Teaching your baby how to latch on to your breast properly is very important. An improper latch can cause nipple pain and decreased milk supply for you and poor weight gain in your baby. Also, if your baby is not latched onto your nipple properly, he or she may swallow some air during feeding. This can make your baby fussy. Burping your baby when you switch breasts during the feeding can help to get rid of the air. However, teaching your baby to latch on properly is still the best way to prevent fussiness from swallowing air while breastfeeding. Signs that your baby has   successfully latched on to your nipple:    Silent tugging or silent sucking, without causing you pain.   Swallowing heard between every 3 4 sucks.    Muscle movement above and in front of his or her ears while sucking.  Signs that your baby has not successfully latched on to nipple:   Sucking sounds or smacking sounds from your baby while breastfeeding.  Nipple pain. If you think your baby has not latched on correctly, slip your finger into the corner of your baby's mouth to break the suction and place it between your baby's gums. Attempt breastfeeding initiation again. Signs of Successful Breastfeeding Signs from your baby:   A gradual decrease in the number of sucks or complete cessation of sucking.   Falling asleep.   Relaxation of his or her body.   Retention of a small amount of milk in his or her mouth.   Letting go of your breast by himself or herself. Signs from you:  Breasts that have increased in firmness, weight, and size 1 3 hours after feeding.   Breasts that are softer immediately after breastfeeding.  Increased milk volume, as well as a change in milk consistency and color by the 5th day of breastfeeding.   Nipples that are not sore, cracked, or bleeding. Signs That Your Baby is Getting Enough Milk  Wetting at least 3 diapers in a 24-hour period. The urine  should be clear and pale yellow by age 5 days.  At least 3 stools in a 24-hour period by age 5 days. The stool should be soft and yellow.  At least 3 stools in a 24-hour period by age 7 days. The stool should be seedy and yellow.  No loss of weight greater than 10% of birth weight during the first 3 days of age.  Average weight gain of 4 7 ounces (120 210 mL) per week after age 4 days.  Consistent daily weight gain by age 5 days, without weight loss after the age of 2 weeks. After a feeding, your baby may spit up a small amount. This is common. BREASTFEEDING FREQUENCY AND DURATION Frequent feeding will help you make more milk and can prevent sore nipples and breast engorgement. Breastfeed when you feel the need to reduce the fullness of your breasts or when your baby shows signs of hunger. This is called "breastfeeding on demand." Avoid introducing a pacifier to your baby while you are working to establish breastfeeding (the first 4 6 weeks after your baby is born). After this time you may choose to use a pacifier. Research has shown that pacifier use during the first year of a baby's life decreases the risk of sudden infant death syndrome (SIDS). Allow your baby to feed on each breast as long as he or she wants. Breastfeed until your baby is finished feeding. When your baby unlatches or falls asleep while feeding from the first breast, offer the second breast. Because newborns are often sleepy in the first few weeks of life, you may need to awaken your baby to get him or her to feed. Breastfeeding times will vary from baby to baby. However, the following rules can serve as a guide to help you ensure that your baby is properly fed:  Newborns (babies 4 weeks of age or younger) may breastfeed every 1 3 hours.  Newborns should not go longer than 3 hours during the day or 5 hours during the night without breastfeeding.  You should breastfeed your baby a minimum of   8 times in a 24-hour period until  you begin to introduce solid foods to your baby at around 6 months of age. BREAST MILK PUMPING Pumping and storing breast milk allows you to ensure that your baby is exclusively fed your breast milk, even at times when you are unable to breastfeed. This is especially important if you are going back to work while you are still breastfeeding or when you are not able to be present during feedings. Your lactation consultant can give you guidelines on how long it is safe to store breast milk.  A breast pump is a machine that allows you to pump milk from your breast into a sterile bottle. The pumped breast milk can then be stored in a refrigerator or freezer. Some breast pumps are operated by hand, while others use electricity. Ask your lactation consultant which type will work best for you. Breast pumps can be purchased, but some hospitals and breastfeeding support groups lease breast pumps on a monthly basis. A lactation consultant can teach you how to hand express breast milk, if you prefer not to use a pump.  CARING FOR YOUR BREASTS WHILE YOU BREASTFEED Nipples can become dry, cracked, and sore while breastfeeding. The following recommendations can help keep your breasts moisturized and healthy:  Avoid using soap on your nipples.   Wear a supportive bra. Although not required, special nursing bras and tank tops are designed to allow access to your breasts for breastfeeding without taking off your entire bra or top. Avoid wearing underwire style bras or extremely tight bras.  Air dry your nipples for 3 4minutes after each feeding.   Use only cotton bra pads to absorb leaked breast milk. Leaking of breast milk between feedings is normal.   Use lanolin on your nipples after breastfeeding. Lanolin helps to maintain your skin's normal moisture barrier. If you use pure lanolin you do not need to wash it off before feeding your baby again. Pure lanolin is not toxic to your baby. You may also hand express a  few drops of breast milk and gently massage that milk into your nipples and allow the milk to air dry. In the first few weeks after giving birth, some women experience extremely full breasts (engorgement). Engorgement can make your breasts feel heavy, warm, and tender to the touch. Engorgement peaks within 3 5 days after you give birth. The following recommendations can help ease engorgement:  Completely empty your breasts while breastfeeding or pumping. You may want to start by applying warm, moist heat (in the shower or with warm water-soaked hand towels) just before feeding or pumping. This increases circulation and helps the milk flow. If your baby does not completely empty your breasts while breastfeeding, pump any extra milk after he or she is finished.  Wear a snug bra (nursing or regular) or tank top for 1 2 days to signal your body to slightly decrease milk production.  Apply ice packs to your breasts, unless this is too uncomfortable for you.  Make sure that your baby is latched on and positioned properly while breastfeeding. If engorgement persists after 48 hours of following these recommendations, contact your health care provider or a lactation consultant. OVERALL HEALTH CARE RECOMMENDATIONS WHILE BREASTFEEDING  Eat healthy foods. Alternate between meals and snacks, eating 3 of each per day. Because what you eat affects your breast milk, some of the foods may make your baby more irritable than usual. Avoid eating these foods if you are sure that they are   negatively affecting your baby.  Drink milk, fruit juice, and water to satisfy your thirst (about 10 glasses a day).   Rest often, relax, and continue to take your prenatal vitamins to prevent fatigue, stress, and anemia.  Continue breast self-awareness checks.  Avoid chewing and smoking tobacco.  Avoid alcohol and drug use. Some medicines that may be harmful to your baby can pass through breast milk. It is important to ask your  health care provider before taking any medicine, including all over-the-counter and prescription medicine as well as vitamin and herbal supplements. It is possible to become pregnant while breastfeeding. If birth control is desired, ask your health care provider about options that will be safe for your baby. SEEK MEDICAL CARE IF:   You feel like you want to stop breastfeeding or have become frustrated with breastfeeding.  You have painful breasts or nipples.  Your nipples are cracked or bleeding.  Your breasts are red, tender, or warm.  You have a swollen area on either breast.  You have a fever or chills.  You have nausea or vomiting.  You have drainage other than breast milk from your nipples.  Your breasts do not become full before feedings by the 5th day after you give birth.  You feel sad and depressed.  Your baby is too sleepy to eat well.  Your baby is having trouble sleeping.   Your baby is wetting less than 3 diapers in a 24-hour period.  Your baby has less than 3 stools in a 24-hour period.  Your baby's skin or the white part of his or her eyes becomes yellow.   Your baby is not gaining weight by 5 days of age. SEEK IMMEDIATE MEDICAL CARE IF:   Your baby is overly tired (lethargic) and does not want to wake up and feed.  Your baby develops an unexplained fever. Document Released: 09/22/2005 Document Revised: 05/25/2013 Document Reviewed: 03/16/2013 [employer] Patient Information 2014 [employer].  

## 2013-09-14 ENCOUNTER — Other Ambulatory Visit: Payer: BC Managed Care – PPO

## 2013-10-05 ENCOUNTER — Encounter: Payer: BC Managed Care – PPO | Admitting: Obstetrics & Gynecology

## 2013-11-01 ENCOUNTER — Encounter: Payer: Self-pay | Admitting: Obstetrics & Gynecology

## 2014-07-14 ENCOUNTER — Encounter (HOSPITAL_COMMUNITY): Payer: Self-pay | Admitting: *Deleted

## 2014-08-07 ENCOUNTER — Encounter (HOSPITAL_COMMUNITY): Payer: Self-pay | Admitting: *Deleted

## 2014-10-02 ENCOUNTER — Encounter: Payer: Self-pay | Admitting: *Deleted

## 2015-12-10 ENCOUNTER — Encounter (HOSPITAL_COMMUNITY): Payer: Self-pay | Admitting: Emergency Medicine

## 2015-12-10 ENCOUNTER — Emergency Department (HOSPITAL_COMMUNITY): Payer: BLUE CROSS/BLUE SHIELD

## 2015-12-10 ENCOUNTER — Emergency Department (HOSPITAL_COMMUNITY)
Admission: EM | Admit: 2015-12-10 | Discharge: 2015-12-10 | Disposition: A | Discharge status: Home / Self Care | Payer: BLUE CROSS/BLUE SHIELD | Attending: Emergency Medicine | Admitting: Emergency Medicine

## 2015-12-10 DIAGNOSIS — Z8751 Personal history of pre-term labor: Secondary | ICD-10-CM | POA: Insufficient documentation

## 2015-12-10 DIAGNOSIS — R0789 Other chest pain: Secondary | ICD-10-CM | POA: Diagnosis not present

## 2015-12-10 DIAGNOSIS — R079 Chest pain, unspecified: Secondary | ICD-10-CM | POA: Diagnosis present

## 2015-12-10 LAB — I-STAT TROPONIN, ED: Troponin i, poc: 0 ng/mL (ref 0.00–0.08)

## 2015-12-10 LAB — CBC
HEMATOCRIT: 41.4 % (ref 36.0–46.0)
HEMOGLOBIN: 13.8 g/dL (ref 12.0–15.0)
MCH: 28.5 pg (ref 26.0–34.0)
MCHC: 33.3 g/dL (ref 30.0–36.0)
MCV: 85.4 fL (ref 78.0–100.0)
Platelets: 235 10*3/uL (ref 150–400)
RBC: 4.85 MIL/uL (ref 3.87–5.11)
RDW: 13.3 % (ref 11.5–15.5)
WBC: 12.1 10*3/uL — AB (ref 4.0–10.5)

## 2015-12-10 LAB — BASIC METABOLIC PANEL
ANION GAP: 13 (ref 5–15)
BUN: 16 mg/dL (ref 6–20)
CO2: 21 mmol/L — ABNORMAL LOW (ref 22–32)
Calcium: 8.9 mg/dL (ref 8.9–10.3)
Chloride: 104 mmol/L (ref 101–111)
Creatinine, Ser: 0.41 mg/dL — ABNORMAL LOW (ref 0.44–1.00)
GFR calc Af Amer: >60 mL/min (ref >60)
GLUCOSE: 89 mg/dL (ref 65–99)
POTASSIUM: 3.5 mmol/L (ref 3.5–5.1)
Sodium: 138 mmol/L (ref 135–145)

## 2015-12-10 MED ORDER — PREDNISONE 20 MG PO TABS: 40.0000 mg | ORAL_TABLET | Freq: Every day | ORAL | Status: DC

## 2015-12-10 MED ORDER — METHOCARBAMOL 750 MG PO TABS: 750.0000 mg | ORAL_TABLET | Freq: Four times a day (QID) | ORAL | Status: DC

## 2015-12-10 NOTE — Discharge Instructions (Signed)

## 2015-12-10 NOTE — ED Provider Notes (Signed)
CSN: [id number]     Arrival date & time 12/10/15  1643 History   First MD Initiated Contact with Patient 12/10/15 2110     Chief Complaint  Patient presents with  . Chest Pain     (Consider location/radiation/quality/duration/timing/severity/associated sxs/prior Treatment) HPI Comments: Patient here complaining of 2 days of persistent right-sided chest pain characterized as sharp and worse with moving. No associated dyspnea, diaphoresis, exertional component to it. Pain is also worse with moving her arm. She has used NSAIDs with minimal relief. Denies any leg pain or swelling. No anginal qualities. Denies any rashes. No prior history of same  Patient is a 40 y.o. female presenting with chest pain. The history is provided by the patient.  Chest Pain   Past Medical History  Diagnosis Date  . Preterm labor    Past Surgical History  Procedure Laterality Date  . Cesarean section    . Cyst excision     Family History  Problem Relation Age of Onset  . Hypertension Mother   . Cancer Father   . Diabetes Father    Social History  Substance Use Topics  . Smoking status: Never Smoker   . Smokeless tobacco: Never Used  . Alcohol Use: No     Comment: occasional use before pregnancy   OB History    Gravida Para Term Preterm AB TAB SAB Ectopic Multiple Living   4 1  1 2     1      Review of Systems  Cardiovascular: Positive for chest pain.  All other systems reviewed and are negative.     Allergies  Diflucan  Home Medications   Prior to Admission medications   Medication Sig Start Date End Date Taking? Authorizing Provider  acetaminophen (TYLENOL) 500 MG tablet Take 500 mg by mouth every 6 (six) hours as needed.    Historical Provider, MD  methocarbamol (ROBAXIN-750) 750 MG tablet Take 1 tablet (750 mg total) by mouth 4 (four) times daily. 12/10/15   Lacretia Leigh, MD  predniSONE (DELTASONE) 20 MG tablet Take 2 tablets (40 mg total) by mouth daily. 12/10/15   Lacretia Leigh, MD    BP 154/97 mmHg  Pulse 86  Temp(Src) 98.2 F (36.8 C) (Oral)  Resp 18  SpO2 100%  LMP 12/10/2015 Physical Exam  Constitutional: She is oriented to person, place, and time. She appears well-developed and well-nourished.  Non-toxic appearance. No distress.  HENT:  Head: Normocephalic and atraumatic.  Eyes: Conjunctivae, EOM and lids are normal. Pupils are equal, round, and reactive to light.  Neck: Normal range of motion. Neck supple. No tracheal deviation present. No thyroid mass present.  Cardiovascular: Normal rate, regular rhythm and normal heart sounds.  Exam reveals no gallop.   No murmur heard. Pulmonary/Chest: Effort normal and breath sounds normal. No stridor. No respiratory distress. She has no decreased breath sounds. She has no wheezes. She has no rhonchi. She has no rales. She exhibits tenderness and bony tenderness.    Abdominal: Soft. Normal appearance and bowel sounds are normal. She exhibits no distension. There is no tenderness. There is no rebound and no CVA tenderness.  Musculoskeletal: Normal range of motion. She exhibits no edema or tenderness.  Neurological: She is alert and oriented to person, place, and time. She has normal strength. No cranial nerve deficit or sensory deficit. GCS eye subscore is 4. GCS verbal subscore is 5. GCS motor subscore is 6.  Skin: Skin is warm and dry. No abrasion and no rash noted.  Psychiatric: She has a normal mood and affect. Her speech is normal and behavior is normal.  Nursing note and vitals reviewed.   ED Course  Procedures (including critical care time) Labs Review Labs Reviewed  BASIC METABOLIC PANEL - Abnormal; Notable for the following:    CO2 21 (*)    Creatinine, Ser 0.41 (*)    All other components within normal limits  CBC - Abnormal; Notable for the following:    WBC 12.1 (*)    All other components within normal limits  I-STAT TROPOININ, ED    Imaging Review Dg Chest 2 View  12/10/2015  CLINICAL DATA:  Two  days of mid chest pain and shortness of breath, no known heart problems, nonsmoker EXAM: CHEST  2 VIEW COMPARISON:  None in PACs FINDINGS: The lungs are adequately inflated. There is no focal infiltrate. There is no pleural effusion or pneumothorax. The heart and pulmonary vascularity are normal. The mediastinum is normal in width. 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 IMPRESSION: There is no active cardiopulmonary disease. Electronically Signed   By: David  Martinique M.D.   On: 12/10/2015 17:16   I have personally reviewed and evaluated these images and lab results as part of my medical decision-making.   EKG Interpretation   Date/Time:  Monday December 10 2015 16:51:19 EST Ventricular Rate:  84 PR Interval:  152 QRS Duration: 91 QT Interval:  377 QTC Calculation: 446 R Axis:   22 Text Interpretation:  Sinus rhythm Low voltage, precordial leads No old  tracing to compare Confirmed by Winfred Leeds  MD, SAM [phone]) on 12/10/2015  5:02:52 PM      MDM   Final diagnoses:  Chest wall pain    Patient with musculoskeletal chest pain. Do not think this represents ACS or PE. Return precautions given    Lacretia Leigh, MD 12/10/15 2133

## 2015-12-10 NOTE — ED Notes (Signed)
Discharge instructions, follow up care, and rx x2 reviewed with patient. Patient verbalized understanding. 

## 2015-12-10 NOTE — ED Notes (Signed)
Pt complaint of right chest pain radiating to right neck and arm onset Saturday; denies associated symptoms.

## 2016-10-11 IMAGING — CR DG CHEST 2V
2 series · 2 of 2 positions shown · non-contrast
Comparison: None in PACs

CLINICAL DATA: Two days of mid chest pain and shortness of breath,
no known heart problems, nonsmoker

EXAM:
CHEST  2 VIEW

[w chest pa]
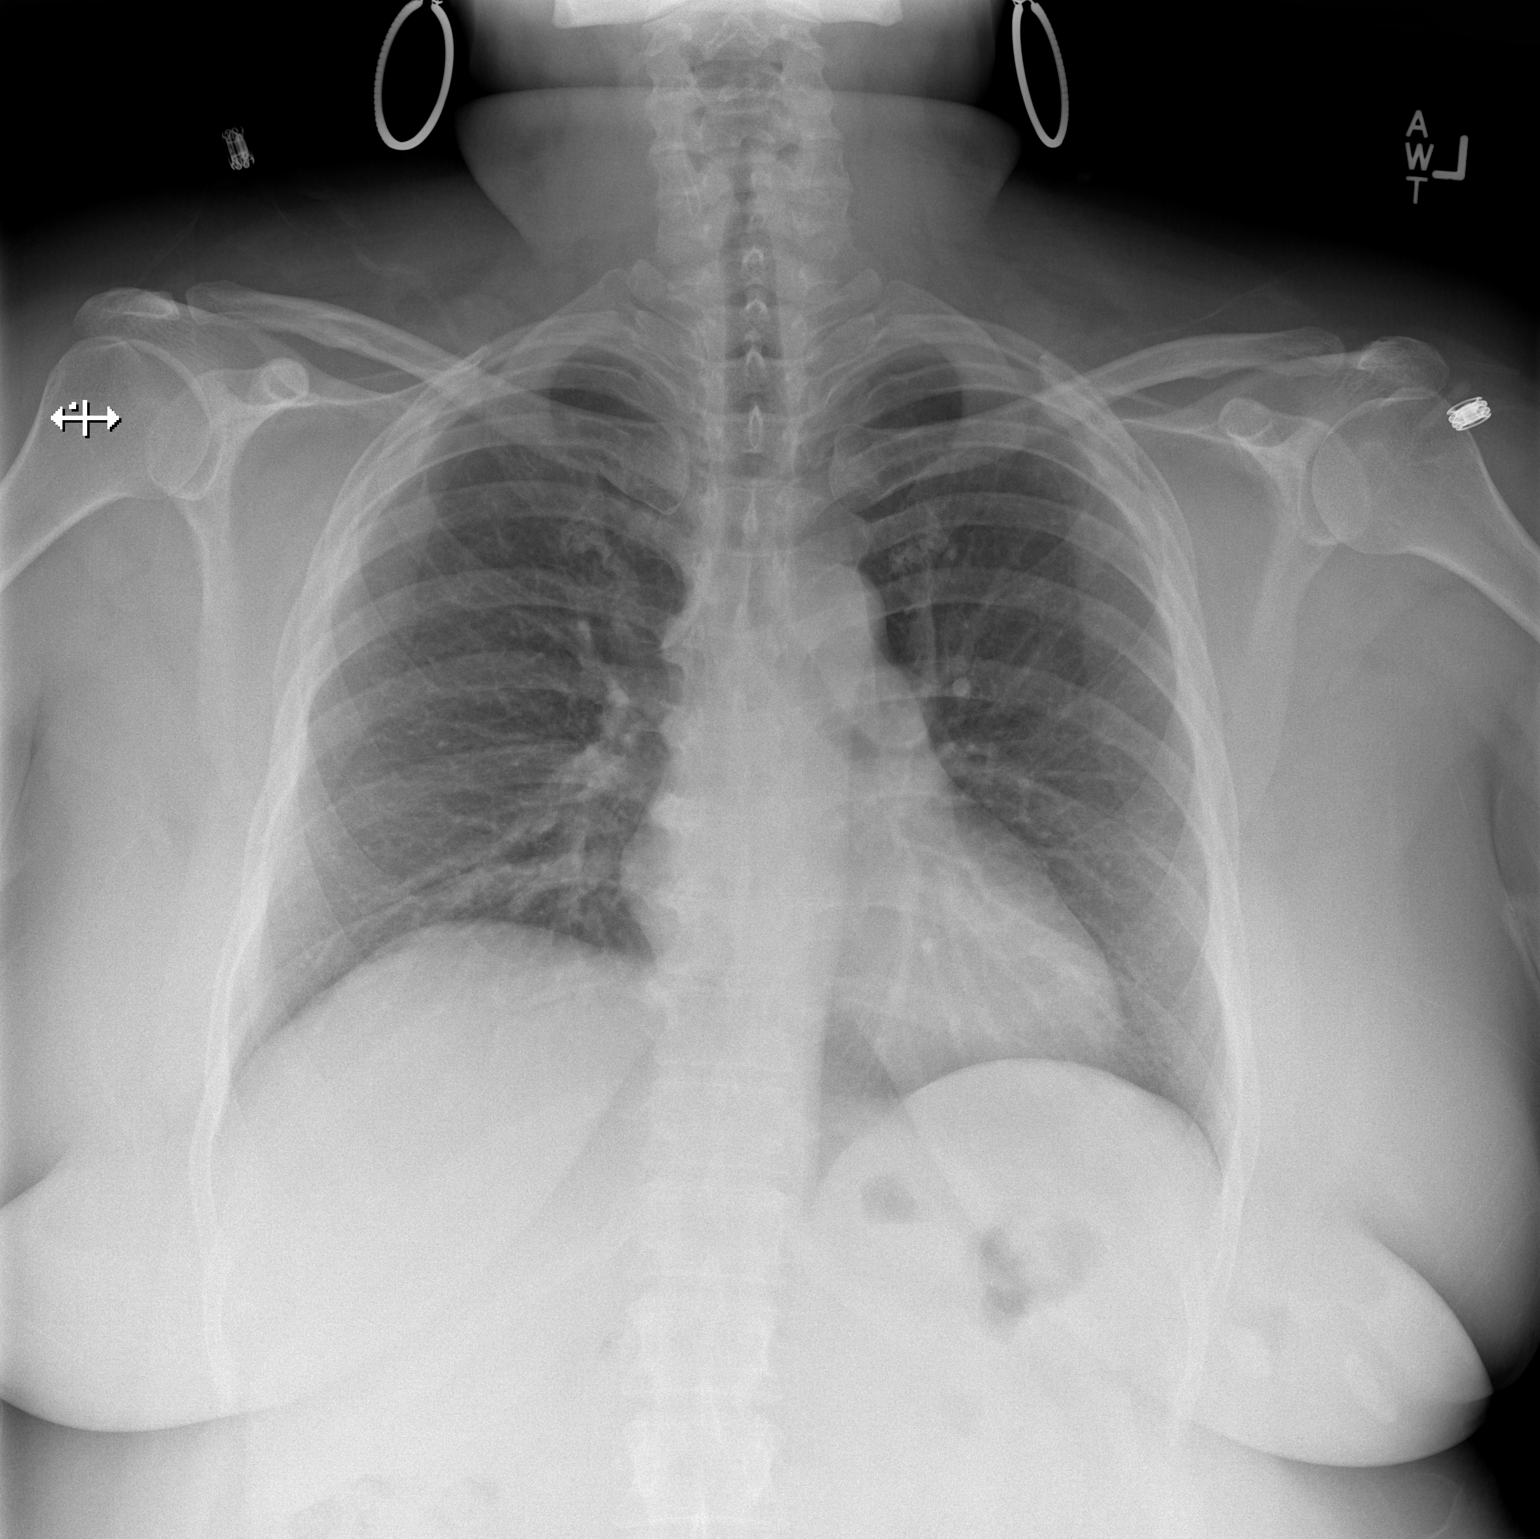

[w chest lat]
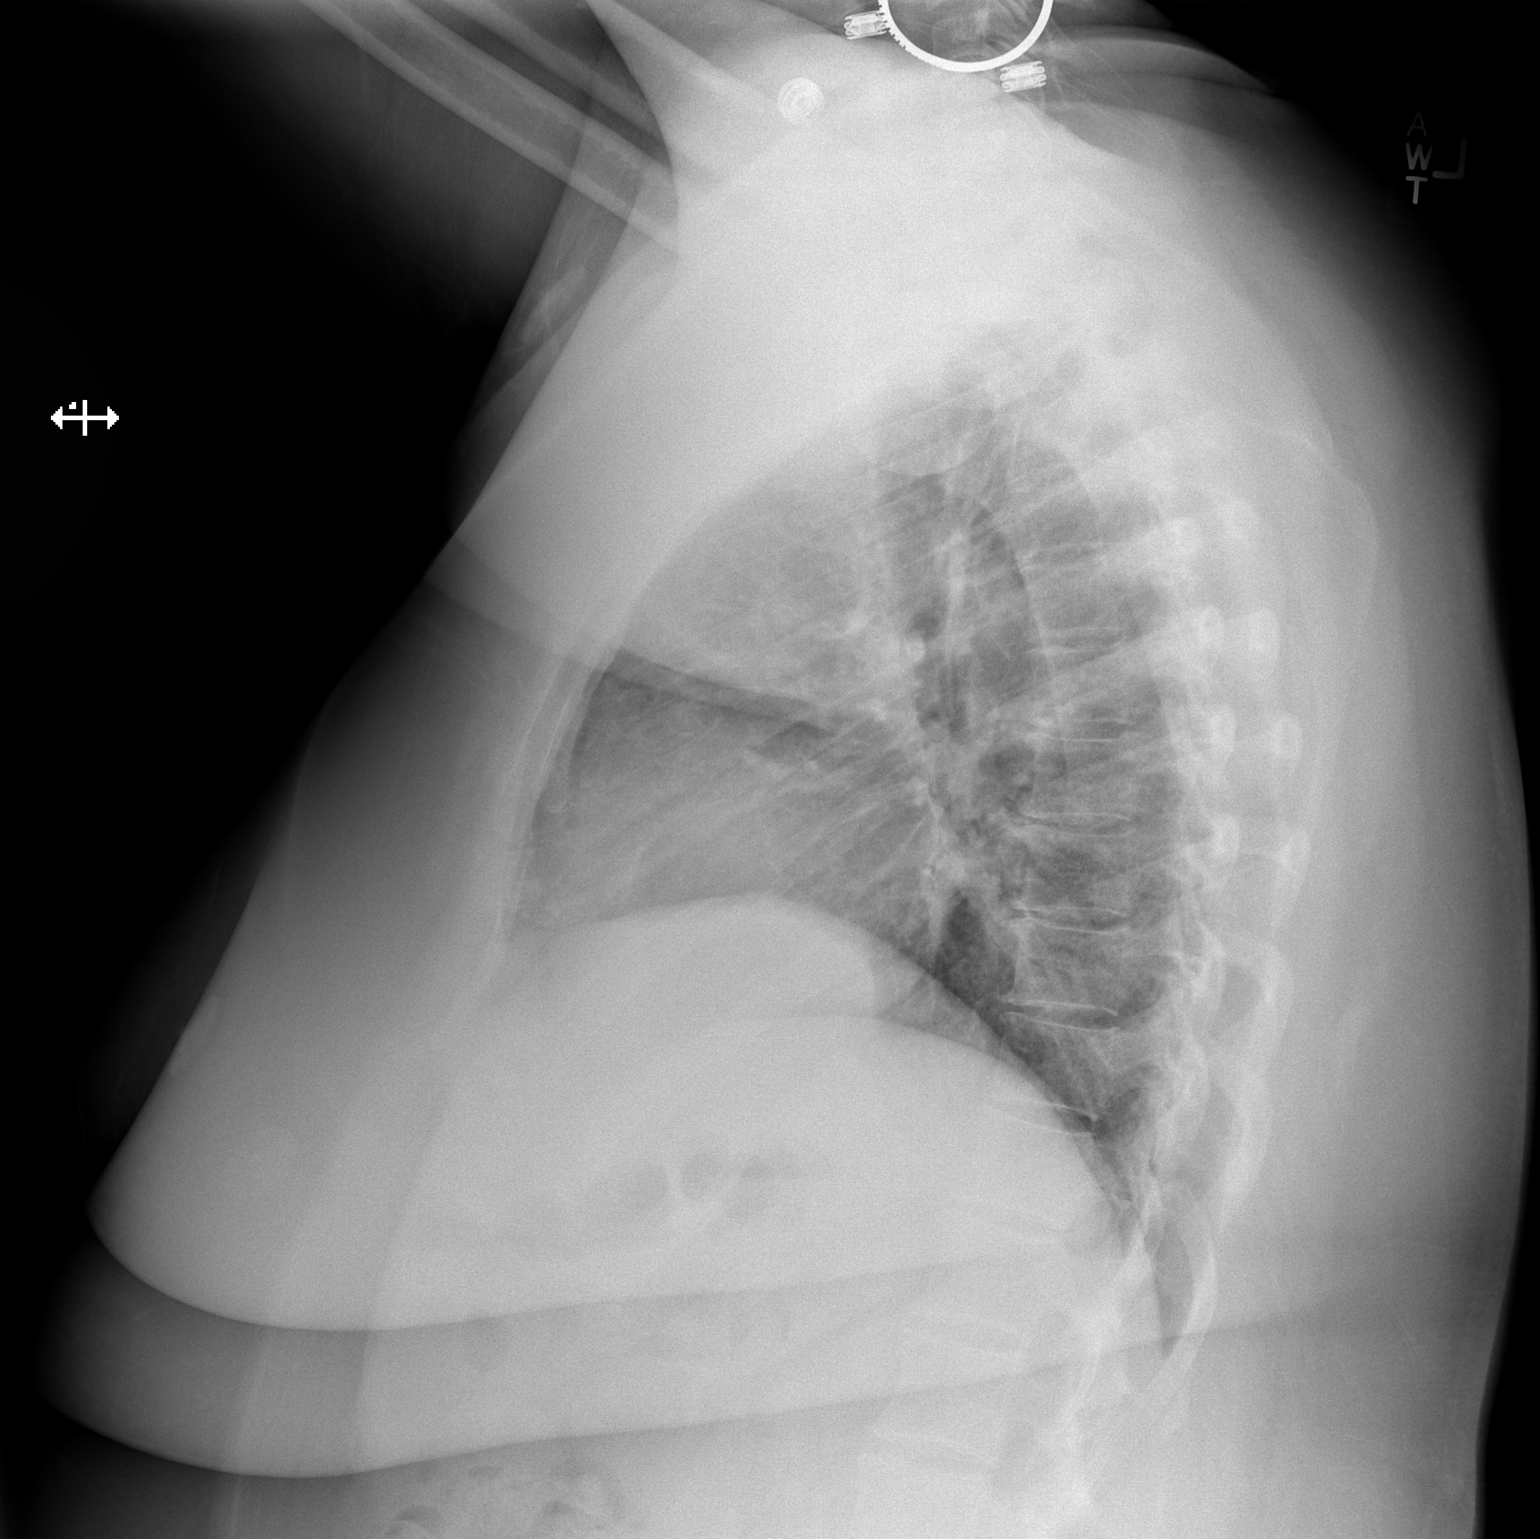

[2 of 2 positions shown; findings below may reference images not displayed]

FINDINGS: The lungs are adequately inflated. There is no focal infiltrate.
There is no pleural effusion or pneumothorax. The heart and
pulmonary vascularity are normal. The mediastinum is normal in
width. There is mild multilevel degenerative disc disease of the
thoracic spine.
IMPRESSION: There is no active cardiopulmonary disease.

## 2020-12-11 ENCOUNTER — Other Ambulatory Visit: Payer: Self-pay

## 2020-12-11 ENCOUNTER — Ambulatory Visit (INDEPENDENT_AMBULATORY_CARE_PROVIDER_SITE_OTHER): Payer: 59 | Admitting: Family Medicine

## 2020-12-11 ENCOUNTER — Encounter: Payer: Self-pay | Admitting: Family Medicine

## 2020-12-11 VITALS — BP 123/85 | HR 88 | Temp 98.0°F | Ht 59.75 in | Wt 240.4 lb

## 2020-12-11 DIAGNOSIS — Z0001 Encounter for general adult medical examination with abnormal findings: Secondary | ICD-10-CM | POA: Diagnosis not present

## 2020-12-11 DIAGNOSIS — Z1211 Encounter for screening for malignant neoplasm of colon: Secondary | ICD-10-CM | POA: Diagnosis not present

## 2020-12-11 DIAGNOSIS — Z131 Encounter for screening for diabetes mellitus: Secondary | ICD-10-CM | POA: Diagnosis not present

## 2020-12-11 DIAGNOSIS — Z13220 Encounter for screening for lipoid disorders: Secondary | ICD-10-CM | POA: Diagnosis not present

## 2020-12-11 DIAGNOSIS — Z6841 Body Mass Index (BMI) 40.0 and over, adult: Secondary | ICD-10-CM

## 2020-12-11 NOTE — Progress Notes (Signed)
Chief Complaint:  [name] is a 45 y.o. female who presents today for her annual comprehensive physical exam.  She is a new patient.   Assessment/Plan:  Chronic Problems Addressed Today: Body mass index is 47.34 kg/m. / Obesity  BMI Metric Follow Up - 12/11/20 1455      BMI Metric Follow Up-Please document annually   BMI Metric Follow Up Education provided           Preventative Healthcare: Check labs today.  Due for Pap in 2024.  Place referral for colonoscopy.  Has first-degree family history of colon cancer in her father.  He was diagnosed at age 37.  Patient Counseling(The following topics were reviewed and/or handout was given):  -Nutrition: Stressed importance of moderation in sodium/caffeine intake, saturated fat and cholesterol, caloric balance, sufficient intake of fresh fruits, vegetables, and fiber.  -Stressed the importance of regular exercise.   -Substance Abuse: Discussed cessation/primary prevention of tobacco, alcohol, or other drug use; driving or other dangerous activities under the influence; availability of treatment for abuse.   -Injury prevention: Discussed safety belts, safety helmets, smoke detector, smoking near bedding or upholstery.   -Sexuality: Discussed sexually transmitted diseases, partner selection, use of condoms, avoidance of unintended pregnancy and contraceptive alternatives.   -Dental health: Discussed importance of regular tooth brushing, flossing, and dental visits.  -Health maintenance and immunizations reviewed. Please refer to Health maintenance section.  Return to care in 1 year for next preventative visit.     Subjective:  HPI:  She has no acute complaints today.   Lifestyle Diet: Cutting down on portion sizes.  Exercise: Trying to get half.   Depression screen PHQ 2/9 12/11/2020  Decreased Interest 0  Down, Depressed, Hopeless 0  PHQ - 2 Score 0    Health Maintenance Due  Topic Date Due  . Hepatitis C Screening   Never done  . HIV Screening  Never done  . TETANUS/TDAP  Never done  . PAP SMEAR-Modifier  08/02/2016     ROS: Per HPI, otherwise a complete review of systems was negative.   PMH:  The following were reviewed and entered/updated in epic: Past Medical History:  Diagnosis Date  . Preterm labor    There are no problems to display for this patient.  Past Surgical History:  Procedure Laterality Date  . CESAREAN SECTION    . CYST EXCISION      Family History  Problem Relation Age of Onset  . Hypertension Mother   . Cancer Father        colon cancer  . Diabetes Father   . Diabetes Sister   . Gout Sister     Medications- reviewed and updated No current outpatient medications on file.   No current facility-administered medications for this visit.    Allergies-reviewed and updated Allergies  Allergen Reactions  . Diflucan [Fluconazole]     Rash     Social History   Socioeconomic History  . Marital status: Married    Spouse name: Not on file  . Number of children: Not on file  . Years of education: Not on file  . Highest education level: Not on file  Occupational History  . Not on file  Tobacco Use  . Smoking status: Never Smoker  . Smokeless tobacco: Never Used  Substance and Sexual Activity  . Alcohol use: No    Comment: occasional  . Drug use: No  . Sexual activity: Yes    Birth control/protection: None  Other Topics  Concern  . Not on file  Social History Narrative  . Not on file   Social Determinants of Health   Financial Resource Strain: Not on file  Food Insecurity: Not on file  Transportation Needs: Not on file  Physical Activity: Not on file  Stress: Not on file  Social Connections: Not on file        Objective:  Physical Exam: BP 123/85   Pulse 88   Temp 98 F (36.7 C) (Oral)   Ht 4' 11.75" (1.518 m)   Wt 240 lb 6.4 oz (109 kg)   LMP 12/03/2020   SpO2 99%   BMI 47.34 kg/m   Body mass index is 47.34 kg/m. Wt Readings from  Last 3 Encounters:  12/11/20 240 lb 6.4 oz (109 kg)  09/08/13 224 lb (101.6 kg)   Gen: NAD, resting comfortably HEENT: TMs normal bilaterally. OP clear. No thyromegaly noted.  CV: RRR with no murmurs appreciated Pulm: NWOB, CTAB with no crackles, wheezes, or rhonchi GI: Normal bowel sounds present. Soft, Nontender, Nondistended. MSK: no edema, cyanosis, or clubbing noted Skin: warm, dry Neuro: CN2-12 grossly intact. Strength 5/5 in upper and lower extremities. Reflexes symmetric and intact bilaterally.  Psych: Normal affect and thought content     Caleb M. [name], MD 12/11/2020 2:55 PM

## 2020-12-11 NOTE — Patient Instructions (Signed)
It was very nice to see you today!  We will check blood work today.  Please keep up the good work with your diet and exercise.  You will be due for your next Pap in 2024.  I will place a referral for you to get your colon cancer screening done return 35.  I will see back in year for your next physical.  Please come back to see me sooner if needed.  Take care, Dr [name]  Please try these tips to maintain a healthy lifestyle:   Eat at least 3 REAL meals and 1-2 snacks per day.  Aim for no more than 5 hours between eating.  If you eat breakfast, please do so within one hour of getting up.    Each meal should contain half fruits/vegetables, one quarter protein, and one quarter carbs (no bigger than a computer mouse)   Cut down on sweet beverages. This includes juice, soda, and sweet tea.     Drink at least 1 glass of water with each meal and aim for at least 8 glasses per day   Exercise at least 150 minutes every week.    Preventive Care 63-24 Years Old, Female Preventive care refers to lifestyle choices and visits with your health care provider that can promote health and wellness. This includes:  A yearly physical exam. This is also called an annual wellness visit.  Regular dental and eye exams.  Immunizations.  Screening for certain conditions.  Healthy lifestyle choices, such as: ? Eating a healthy diet. ? Getting regular exercise. ? Not using drugs or products that contain nicotine and tobacco. ? Limiting alcohol use. What can I expect for my preventive care visit? Physical exam Your health care provider will check your:  Height and weight. These may be used to calculate your BMI (body mass index). BMI is a measurement that tells if you are at a healthy weight.  Heart rate and blood pressure.  Body temperature.  Skin for abnormal spots. Counseling Your health care provider may ask you questions about your:  Past medical problems.  Family's medical  history.  Alcohol, tobacco, and drug use.  Emotional well-being.  Home life and relationship well-being.  Sexual activity.  Diet, exercise, and sleep habits.  Work and work Statistician.  Access to firearms.  Method of birth control.  Menstrual cycle.  Pregnancy history. What immunizations do I need? Vaccines are usually given at various ages, according to a schedule. Your health care provider will recommend vaccines for you based on your age, medical history, and lifestyle or other factors, such as travel or where you work.   What tests do I need? Blood tests  Lipid and cholesterol levels. These may be checked every 5 years, or more often if you are over 3 years old.  Hepatitis C test.  Hepatitis B test. Screening  Lung cancer screening. You may have this screening every year starting at age 13 if you have a 30-pack-year history of smoking and currently smoke or have quit within the past 15 years.  Colorectal cancer screening. ? All adults should have this screening starting at age 15 and continuing until age 61. ? Your health care provider may recommend screening at age 7 if you are at increased risk. ? You will have tests every 1-10 years, depending on your results and the type of screening test.  Diabetes screening. ? This is done by checking your blood sugar (glucose) after you have not eaten for a while (fasting). ?  You may have this done every 1-3 years.  Mammogram. ? This may be done every 1-2 years. ? Talk with your health care provider about when you should start having regular mammograms. This may depend on whether you have a family history of breast cancer.  BRCA-related cancer screening. This may be done if you have a family history of breast, ovarian, tubal, or peritoneal cancers.  Pelvic exam and Pap test. ? This may be done every 3 years starting at age 66. ? Starting at age 32, this may be done every 5 years if you have a Pap test in combination  with an HPV test. Other tests  STD (sexually transmitted disease) testing, if you are at risk.  Bone density scan. This is done to screen for osteoporosis. You may have this scan if you are at high risk for osteoporosis. Talk with your health care provider about your test results, treatment options, and if necessary, the need for more tests. Follow these instructions at home: Eating and drinking  Eat a diet that includes fresh fruits and vegetables, whole grains, lean protein, and low-fat dairy products.  Take vitamin and mineral supplements as recommended by your health care provider.  Do not drink alcohol if: ? Your health care provider tells you not to drink. ? You are pregnant, may be pregnant, or are planning to become pregnant.  If you drink alcohol: ? Limit how much you have to 0-1 drink a day. ? Be aware of how much alcohol is in your drink. In the [place] one drink equals one 12 oz bottle of beer (355 mL), one 5 oz glass of wine (148 mL), or one 1 oz glass of hard liquor (44 mL).   Lifestyle  Take daily care of your teeth and gums. Brush your teeth every morning and night with fluoride toothpaste. Floss one time each day.  Stay active. Exercise for at least 30 minutes 5 or more days each week.  Do not use any products that contain nicotine or tobacco, such as cigarettes, e-cigarettes, and chewing tobacco. If you need help quitting, ask your health care provider.  Do not use drugs.  If you are sexually active, practice safe sex. Use a condom or other form of protection to prevent STIs (sexually transmitted infections).  If you do not wish to become pregnant, use a form of birth control. If you plan to become pregnant, see your health care provider for a prepregnancy visit.  If told by your health care provider, take low-dose aspirin daily starting at age 38.  Find healthy ways to cope with stress, such as: ? Meditation, yoga, or listening to  music. ? Journaling. ? Talking to a trusted person. ? Spending time with friends and family. Safety  Always wear your seat belt while driving or riding in a vehicle.  Do not drive: ? If you have been drinking alcohol. Do not ride with someone who has been drinking. ? When you are tired or distracted. ? While texting.  Wear a helmet and other protective equipment during sports activities.  If you have firearms in your house, make sure you follow all gun safety procedures. What's next?  Visit your health care provider once a year for an annual wellness visit.  Ask your health care provider how often you should have your eyes and teeth checked.  Stay up to date on all vaccines. This information is not intended to replace advice given to you by your health care provider. Make sure  you discuss any questions you have with your health care provider. Document Revised: 06/26/2020 Document Reviewed: 06/03/2018 Elsevier Patient Education  2021 [employer].

## 2020-12-12 LAB — LIPID PANEL
Cholesterol: 191 mg/dL (ref 0–200)
HDL: 45.3 mg/dL (ref >39.00)
LDL Cholesterol: 120 mg/dL — ABNORMAL HIGH (ref 0–99)
NonHDL: 145.88
Total CHOL/HDL Ratio: 4
Triglycerides: 129 mg/dL (ref 0.0–149.0)
VLDL: 25.8 mg/dL (ref 0.0–40.0)

## 2020-12-12 LAB — COMPREHENSIVE METABOLIC PANEL
ALT: 9 U/L (ref 0–35)
AST: 13 U/L (ref 0–37)
Albumin: 3.8 g/dL (ref 3.5–5.2)
Alkaline Phosphatase: 54 U/L (ref 39–117)
BUN: 16 mg/dL (ref 6–23)
CO2: 23 mEq/L (ref 19–32)
Calcium: 9.2 mg/dL (ref 8.4–10.5)
Chloride: 103 mEq/L (ref 96–112)
Creatinine, Ser: 0.6 mg/dL (ref 0.40–1.20)
GFR: 109.06 mL/min (ref >60.00)
Glucose, Bld: 88 mg/dL (ref 70–99)
Potassium: 3.9 mEq/L (ref 3.5–5.1)
Sodium: 137 mEq/L (ref 135–145)
Total Bilirubin: 0.5 mg/dL (ref 0.2–1.2)
Total Protein: 7.5 g/dL (ref 6.0–8.3)

## 2020-12-12 LAB — HEMOGLOBIN A1C: Hgb A1c MFr Bld: 6 % (ref 4.6–6.5)

## 2020-12-12 LAB — CBC
HCT: 40.5 % (ref 36.0–46.0)
Hemoglobin: 13.6 g/dL (ref 12.0–15.0)
MCHC: 33.6 g/dL (ref 30.0–36.0)
MCV: 83.6 fl (ref 78.0–100.0)
Platelets: 248 10*3/uL (ref 150.0–400.0)
RBC: 4.84 Mil/uL (ref 3.87–5.11)
RDW: 15.1 % (ref 11.5–15.5)
WBC: 9 10*3/uL (ref 4.0–10.5)

## 2020-12-12 LAB — TSH: TSH: 4.02 u[IU]/mL (ref 0.35–4.50)

## 2020-12-14 NOTE — Progress Notes (Signed)
Please inform patient of the following:  Her "bad" cholesterol was a bit high but overall everything else is good. Do not need to make any changes to her treatment plan. Would like for her to keep working on diet and exercise and we can recheck in a year.  [name]. [name], MD 12/14/2020 4:06 PM

## 2021-05-14 ENCOUNTER — Encounter: Payer: Self-pay | Admitting: Gastroenterology

## 2021-06-25 ENCOUNTER — Ambulatory Visit (AMBULATORY_SURGERY_CENTER): Payer: 59 | Admitting: *Deleted

## 2021-06-25 ENCOUNTER — Other Ambulatory Visit: Payer: Self-pay

## 2021-06-25 VITALS — Ht 61.0 in | Wt 239.0 lb

## 2021-06-25 DIAGNOSIS — Z800 Family history of malignant neoplasm of digestive organs: Secondary | ICD-10-CM

## 2021-06-25 NOTE — Progress Notes (Signed)
No egg or soy allergy known to patient  No issues known to pt with past sedation with any surgeries or procedures Patient denies ever being told they had issues or difficulty with intubation  No FH of Malignant Hyperthermia Pt is not on diet pills Pt is not on  home 02  Pt is not on blood thinners  Pt denies issues with constipation  No A fib or A flutter  EMMI video to pt or via [place] 19 guidelines implemented in PV today with Pt and RN   Pt is fully vaccinated  for Covid    Due to the COVID-19 pandemic we are asking patients to follow certain guidelines.  Pt aware of COVID protocols and [facility] guidelines   Pt verified name, DOB, address and insurance during PV today.  Pt mailed instruction packet of Emmi video, copy of consent form to read and not return, and instructions. PV completed over the phone.  Pt encouraged to call with questions or issues.  My Chart instructions to pt as well

## 2021-07-10 ENCOUNTER — Ambulatory Visit (AMBULATORY_SURGERY_CENTER): Payer: 59 | Admitting: Gastroenterology

## 2021-07-10 ENCOUNTER — Encounter: Payer: Self-pay | Admitting: Gastroenterology

## 2021-07-10 ENCOUNTER — Other Ambulatory Visit: Payer: Self-pay

## 2021-07-10 VITALS — BP 136/94 | HR 68 | Temp 97.7°F | Resp 19 | Ht 59.75 in | Wt 239.0 lb

## 2021-07-10 DIAGNOSIS — K621 Rectal polyp: Secondary | ICD-10-CM | POA: Diagnosis not present

## 2021-07-10 DIAGNOSIS — Z800 Family history of malignant neoplasm of digestive organs: Secondary | ICD-10-CM | POA: Diagnosis not present

## 2021-07-10 DIAGNOSIS — K635 Polyp of colon: Secondary | ICD-10-CM

## 2021-07-10 DIAGNOSIS — D120 Benign neoplasm of cecum: Secondary | ICD-10-CM

## 2021-07-10 DIAGNOSIS — D128 Benign neoplasm of rectum: Secondary | ICD-10-CM

## 2021-07-10 DIAGNOSIS — Z1211 Encounter for screening for malignant neoplasm of colon: Secondary | ICD-10-CM

## 2021-07-10 MED ORDER — SODIUM CHLORIDE 0.9 % IV SOLN: 500.0000 mL | Freq: Once | INTRAVENOUS | Status: DC

## 2021-07-10 NOTE — Patient Instructions (Signed)
Discharge instructions given. Handouts on polyps and Diverticulosis. Resume previous medications. YOU HAD AN ENDOSCOPIC PROCEDURE TODAY AT [facility]:   Refer to the procedure report that was given to you for any specific questions about what was found during the examination.  If the procedure report does not answer your questions, please call your gastroenterologist to clarify.  If you requested that your care partner not be given the details of your procedure findings, then the procedure report has been included in a sealed envelope for you to review at your convenience later.  YOU SHOULD EXPECT: Some feelings of bloating in the abdomen. Passage of more gas than usual.  Walking can help get rid of the air that was put into your GI tract during the procedure and reduce the bloating. If you had a lower endoscopy (such as a colonoscopy or flexible sigmoidoscopy) you may notice spotting of blood in your stool or on the toilet paper. If you underwent a bowel prep for your procedure, you may not have a normal bowel movement for a few days.  Please Note:  You might notice some irritation and congestion in your nose or some drainage.  This is from the oxygen used during your procedure.  There is no need for concern and it should clear up in a day or so.  SYMPTOMS TO REPORT IMMEDIATELY:  Following lower endoscopy (colonoscopy or flexible sigmoidoscopy):  Excessive amounts of blood in the stool  Significant tenderness or worsening of abdominal pains  Swelling of the abdomen that is new, acute  Fever of 100F or higher   For urgent or emergent issues, a gastroenterologist can be reached at any hour by calling [phone]. Do not use MyChart messaging for urgent concerns.    DIET:  We do recommend a small meal at first, but then you may proceed to your regular diet.  Drink plenty of fluids but you should avoid alcoholic beverages for 24 hours.  ACTIVITY:  You should plan to take it  easy for the rest of today and you should NOT DRIVE or use heavy machinery until tomorrow (because of the sedation medicines used during the test).    FOLLOW UP: Our staff will call the number listed on your records 48-72 hours following your procedure to check on you and address any questions or concerns that you may have regarding the information given to you following your procedure. If we do not reach you, we will leave a message.  We will attempt to reach you two times.  During this call, we will ask if you have developed any symptoms of COVID 19. If you develop any symptoms (ie: fever, flu-like symptoms, shortness of breath, cough etc.) before then, please call [phone].  If you test positive for Covid 19 in the 2 weeks post procedure, please call and report this information to [place].    If any biopsies were taken you will be contacted by phone or by letter within the next 1-3 weeks.  Please call us at [phone] if you have not heard about the biopsies in 3 weeks.    SIGNATURES/CONFIDENTIALITY: You and/or your care partner have signed paperwork which will be entered into your electronic medical record.  These signatures attest to the fact that that the information above on your After Visit Summary has been reviewed and is understood.  Full responsibility of the confidentiality of this discharge information lies with you and/or your care-partner.

## 2021-07-10 NOTE — Progress Notes (Addendum)
   Referring Provider: Vivi Barrack, MD Primary Care Physician:  Vivi Barrack, MD  Reason for Procedure:  Colon cancer screening   IMPRESSION:  Need for colon cancer screening Family history of colon cancer (father at age 45) Appropriate candidate for monitored anesthesia care  PLAN: Colonoscopy in the [place] today   HPI: [name] is a 45 y.o. female presents for screening colonoscopy.  No prior colonoscopy or colon cancer screening.  No baseline GI symptoms.   Father with colon cancer at age 27. No other known family history of colon cancer or polyps. No family history of uterine/endometrial cancer, pancreatic cancer or gastric/stomach cancer.   Past Medical History:  Diagnosis Date   Preterm labor     Past Surgical History:  Procedure Laterality Date   CESAREAN SECTION     CYST EXCISION      Current Outpatient Medications  Medication Sig Dispense Refill   Multiple Vitamin (MULTIVITAMIN) tablet Take 1 tablet by mouth daily.     Current Facility-Administered Medications  Medication Dose Route Frequency Provider Last Rate Last Admin   0.9 %  sodium chloride infusion  500 mL Intravenous Once Thornton Park, MD        Allergies as of 07/10/2021 - Review Complete 07/10/2021  Allergen Reaction Noted   Diflucan [fluconazole]  12/10/2015    Family History  Problem Relation Age of Onset   Hypertension Mother    Colon cancer Father    Cancer Father        colon cancer   Diabetes Father    Colon polyps Sister    Diabetes Sister    Gout Sister    Esophageal cancer Neg Hx    Rectal cancer Neg Hx    Stomach cancer Neg Hx      Physical Exam: General:   Alert,  well-nourished, pleasant and cooperative in NAD Head:  Normocephalic and atraumatic. Eyes:  Sclera clear, no icterus.   Conjunctiva pink. Mouth:  No deformity or lesions.   Neck:  Supple; no masses or thyromegaly. Lungs:  Clear throughout to auscultation.   No wheezes. Heart:  Regular  rate and rhythm; no murmurs. Abdomen:  Soft, non-tender, nondistended, normal bowel sounds, no rebound or guarding.  Msk:  Symmetrical. No boney deformities LAD: No inguinal or umbilical LAD Extremities:  No clubbing or edema. Neurologic:  Alert and  oriented x4;  grossly nonfocal Skin:  No obvious rash or bruise. Psych:  Alert and cooperative. Normal mood and affect.     Meredith Kilbride L. Tarri Glenn, MD, MPH 07/10/2021, 9:52 AM

## 2021-07-10 NOTE — Progress Notes (Signed)
Called to room to assist during endoscopic procedure.  Patient ID and intended procedure confirmed with present staff. Received instructions for my participation in the procedure from the performing physician.  

## 2021-07-10 NOTE — Op Note (Signed)
[name] Patient Name: [name] Procedure Date: 07/10/2021 9:53 AM MRN: [id number] Endoscopist: Thornton Park MD, MD Age: 45 Referring MD:  Date of Birth: [birth year] Gender: Female Account #: [id number] Procedure:                Colonoscopy Indications:              Screening for colorectal malignant neoplasm, This                            is the patient's first colonoscopy                           Father with colon cancer at age 41 Medicines:                Monitored Anesthesia Care Procedure:                Pre-Anesthesia Assessment:                           - Prior to the procedure, a History and Physical                            was performed, and patient medications and                            allergies were reviewed. The patient's tolerance of                            previous anesthesia was also reviewed. The risks                            and benefits of the procedure and the sedation                            options and risks were discussed with the patient.                            All questions were answered, and informed consent                            was obtained. Prior Anticoagulants: The patient has                            taken no previous anticoagulant or antiplatelet                            agents. ASA Grade Assessment: III - A patient with                            severe systemic disease. After reviewing the risks                            and benefits, the patient was deemed in  satisfactory condition to undergo the procedure.                           After obtaining informed consent, the colonoscope                            was passed under direct vision. Throughout the                            procedure, the patient's blood pressure, pulse, and                            oxygen saturations were monitored continuously. The                            Olympus CF-HQ190L ([id number])  Colonoscope was                            introduced through the anus and advanced to the 3                            cm into the ileum. A second forward view of the                            right colon was performed. The colonoscopy was                            performed without difficulty. The patient tolerated                            the procedure well. The quality of the bowel                            preparation was good. The terminal ileum, ileocecal                            valve, appendiceal orifice, and rectum were                            photographed. Scope In: 10:04:14 AM Scope Out: 10:16:50 AM Scope Withdrawal Time: 0 hours 10 minutes 26 seconds  Total Procedure Duration: 0 hours 12 minutes 36 seconds  Findings:                 Multiple small and large-mouthed diverticula were                            found in the sigmoid colon and descending colon.                           Two sessile polyps were found in the rectum. The                            polyps were 1 to 2 mm in size. These polyps  were                            removed with a cold biopsy forceps. Resection and                            retrieval were complete. Estimated blood loss was                            minimal.                           There was a medium-sized lipoma, in the proximal                            transverse colon.                           A 5 mm polyp was found on the backside of the                            ileocecal valve. The polyp was sessile. The polyp                            was removed with a cold snare. The remaining                            margins of the polyp were then removed with cold                            forceps. Resection and retrieval were complete.                            Estimated blood loss was minimal.                           The exam was otherwise without abnormality on                            direct and retroflexion  views. Complications:            No immediate complications. Estimated blood loss:                            Minimal. Estimated Blood Loss:     Estimated blood loss was minimal. Impression:               - Diverticulosis in the sigmoid colon and in the                            descending colon.                           - Two 1 to 2 mm polyps in the rectum, removed with  a cold biopsy forceps. Resected and retrieved.                           - Medium-sized lipoma in the proximal transverse                            colon.                           - One 5 mm polyp at the ileocecal valve, removed                            with a cold snare. Resected and retrieved.                           - The examination was otherwise normal on direct                            and retroflexion views. Recommendation:           - Patient has a contact number available for                            emergencies. The signs and symptoms of potential                            delayed complications were discussed with the                            patient. Return to normal activities tomorrow.                            Written discharge instructions were provided to the                            patient.                           - High fiber diet.                           - Continue present medications.                           - Await pathology results.                           - Repeat colonoscopy date to be determined after                            pending pathology results are reviewed for                            surveillance. Surveillance should not exceed 5  years given the family history.                           - Emerging evidence supports eating a diet of                            fruits, vegetables, grains, calcium, and yogurt                            while reducing red meat and alcohol may reduce the                             risk of colon cancer.                           - Thank you for allowing me to be involved in your                            colon cancer prevention. Thornton Park MD, MD 07/10/2021 10:25:38 AM This report has been signed electronically.

## 2021-07-10 NOTE — Progress Notes (Signed)
A and O x3. Report to RN. Tolerated MAC anesthesia well. 

## 2021-07-10 NOTE — Progress Notes (Signed)
Pt's states no medical or surgical changes since previsit or office visit.   Check-in-AER  V/S-DT

## 2021-07-12 ENCOUNTER — Telehealth: Payer: Self-pay

## 2021-07-12 NOTE — Telephone Encounter (Signed)
  Follow up Call-  Call back number 07/10/2021  Post procedure Call Back phone  # [phone]  Permission to leave phone message Yes  Some recent data might be hidden     Patient questions:  Do you have a fever, pain , or abdominal swelling? No. Pain Score  0 *  Have you tolerated food without any problems? Yes.    Have you been able to return to your normal activities? Yes.    Do you have any questions about your discharge instructions: Diet   No. Medications  No. Follow up visit  No.  Do you have questions or concerns about your Care? No.  Actions: * If pain score is 4 or above: No action needed, pain <4.  Have you developed a fever since your procedure? no  2.   Have you had an respiratory symptoms (SOB or cough) since your procedure? no  3.   Have you tested positive for COVID 19 since your procedure no  4.   Have you had any family members/close contacts diagnosed with the COVID 19 since your procedure?  no   If yes to any of these questions please route to Joylene John, RN and Joella Prince, RN

## 2021-07-14 ENCOUNTER — Encounter: Payer: Self-pay | Admitting: Gastroenterology

## 2021-12-27 ENCOUNTER — Encounter: Payer: Self-pay | Admitting: Family Medicine

## 2021-12-30 ENCOUNTER — Telehealth: Payer: Self-pay | Admitting: Family Medicine

## 2021-12-30 ENCOUNTER — Other Ambulatory Visit: Payer: Self-pay

## 2021-12-30 ENCOUNTER — Ambulatory Visit (INDEPENDENT_AMBULATORY_CARE_PROVIDER_SITE_OTHER): Payer: Managed Care, Other (non HMO) | Admitting: Family Medicine

## 2021-12-30 ENCOUNTER — Other Ambulatory Visit (INDEPENDENT_AMBULATORY_CARE_PROVIDER_SITE_OTHER): Payer: Managed Care, Other (non HMO)

## 2021-12-30 VITALS — BP 117/84 | HR 100 | Temp 98.2°F | Ht 59.75 in | Wt 240.0 lb

## 2021-12-30 DIAGNOSIS — Z0000 Encounter for general adult medical examination without abnormal findings: Secondary | ICD-10-CM | POA: Diagnosis not present

## 2021-12-30 DIAGNOSIS — Z0001 Encounter for general adult medical examination with abnormal findings: Secondary | ICD-10-CM | POA: Diagnosis not present

## 2021-12-30 DIAGNOSIS — R7303 Prediabetes: Secondary | ICD-10-CM | POA: Diagnosis not present

## 2021-12-30 DIAGNOSIS — E669 Obesity, unspecified: Secondary | ICD-10-CM | POA: Insufficient documentation

## 2021-12-30 LAB — LIPID PANEL
Cholesterol: 182 mg/dL (ref 0–200)
HDL: 46.5 mg/dL (ref >39.00)
LDL Cholesterol: 118 mg/dL — ABNORMAL HIGH (ref 0–99)
NonHDL: 135.87
Total CHOL/HDL Ratio: 4
Triglycerides: 87 mg/dL (ref 0.0–149.0)
VLDL: 17.4 mg/dL (ref 0.0–40.0)

## 2021-12-30 LAB — COMPREHENSIVE METABOLIC PANEL WITH GFR
ALT: 10 U/L (ref 0–35)
AST: 17 U/L (ref 0–37)
Albumin: 4 g/dL (ref 3.5–5.2)
Alkaline Phosphatase: 56 U/L (ref 39–117)
BUN: 14 mg/dL (ref 6–23)
CO2: 22 meq/L (ref 19–32)
Calcium: 8.7 mg/dL (ref 8.4–10.5)
Chloride: 103 meq/L (ref 96–112)
Creatinine, Ser: 0.52 mg/dL (ref 0.40–1.20)
GFR: 112.05 mL/min
Glucose, Bld: 94 mg/dL (ref 70–99)
Potassium: 3.9 meq/L (ref 3.5–5.1)
Sodium: 135 meq/L (ref 135–145)
Total Bilirubin: 0.5 mg/dL (ref 0.2–1.2)
Total Protein: 7.7 g/dL (ref 6.0–8.3)

## 2021-12-30 LAB — CBC WITH DIFFERENTIAL/PLATELET
Basophils Absolute: 0.1 K/uL (ref 0.0–0.1)
Basophils Relative: 0.7 % (ref 0.0–3.0)
Eosinophils Absolute: 0.1 K/uL (ref 0.0–0.7)
Eosinophils Relative: 1.5 % (ref 0.0–5.0)
HCT: 39.8 % (ref 36.0–46.0)
Hemoglobin: 12.9 g/dL (ref 12.0–15.0)
Lymphocytes Relative: 37.6 % (ref 12.0–46.0)
Lymphs Abs: 3.1 K/uL (ref 0.7–4.0)
MCHC: 32.4 g/dL (ref 30.0–36.0)
MCV: 85 fl (ref 78.0–100.0)
Monocytes Absolute: 0.4 K/uL (ref 0.1–1.0)
Monocytes Relative: 4.8 % (ref 3.0–12.0)
Neutro Abs: 4.6 K/uL (ref 1.4–7.7)
Neutrophils Relative %: 55.4 % (ref 43.0–77.0)
Platelets: 219 K/uL (ref 150.0–400.0)
RBC: 4.69 Mil/uL (ref 3.87–5.11)
RDW: 14.6 % (ref 11.5–15.5)
WBC: 8.3 K/uL (ref 4.0–10.5)

## 2021-12-30 LAB — HEMOGLOBIN A1C: Hgb A1c MFr Bld: 6.2 % (ref 4.6–6.5)

## 2021-12-30 MED ORDER — OZEMPIC (0.25 OR 0.5 MG/DOSE) 2 MG/1.5ML ~~LOC~~ SOPN: 0.2500 mg | PEN_INJECTOR | SUBCUTANEOUS | 0 refills | Status: DC

## 2021-12-30 NOTE — Patient Instructions (Addendum)
It was very nice to see you today! ? ?Please start the Iselin.  Take 0.25 mg weekly for the first 4 weeks.  Send me a message in a few weeks to let me know how that is working. ? ?Please try the exercises for her hands and wear a splint.  Let me know if not improving. ? ?I will see back in 1 year for your next physical.  Come back sooner if needed. ? ?Take care, ?Dr [name] ? ?PLEASE NOTE: ? ?If you had any lab tests please let us know if you have not heard back within a few days. You may see your results on mychart before we have a chance to review them but we will give you a call once they are reviewed by [place]. If we ordered any referrals today, please let us know if you have not heard from their office within the next week.  ? ?Please try these tips to maintain a healthy lifestyle: ? ?Eat at least 3 REAL meals and 1-2 snacks per day.  Aim for no more than 5 hours between eating.  If you eat breakfast, please do so within one hour of getting up.  ? ?Each meal should contain half fruits/vegetables, one quarter protein, and one quarter carbs (no bigger than a computer mouse) ? ?Cut down on sweet beverages. This includes juice, soda, and sweet tea.  ? ?Drink at least 1 glass of water with each meal and aim for at least 8 glasses per day ? ?Exercise at least 150 minutes every week.   ? ? ?Preventive Care 31-50 Years Old, Female ?Preventive care refers to lifestyle choices and visits with your health care provider that can promote health and wellness. Preventive care visits are also called wellness exams. ?What can I expect for my preventive care visit? ?Counseling ?Your health care provider may ask you questions about your: ?Medical history, including: ?Past medical problems. ?Family medical history. ?Pregnancy history. ?Current health, including: ?Menstrual cycle. ?Method of birth control. ?Emotional well-being. ?Home life and relationship well-being. ?Sexual activity and sexual health. ?Lifestyle, including: ?Alcohol,  nicotine or tobacco, and drug use. ?Access to firearms. ?Diet, exercise, and sleep habits. ?Work and work Statistician. ?Sunscreen use. ?Safety issues such as seatbelt and bike helmet use. ?Physical exam ?Your health care provider will check your: ?Height and weight. These may be used to calculate your BMI (body mass index). BMI is a measurement that tells if you are at a healthy weight. ?Waist circumference. This measures the distance around your waistline. This measurement also tells if you are at a healthy weight and may help predict your risk of certain diseases, such as type 2 diabetes and high blood pressure. ?Heart rate and blood pressure. ?Body temperature. ?Skin for abnormal spots. ?What immunizations do I need? ?Vaccines are usually given at various ages, according to a schedule. Your health care provider will recommend vaccines for you based on your age, medical history, and lifestyle or other factors, such as travel or where you work. ?What tests do I need? ?Screening ?Your health care provider may recommend screening tests for certain conditions. This may include: ?Lipid and cholesterol levels. ?Diabetes screening. This is done by checking your blood sugar (glucose) after you have not eaten for a while (fasting). ?Pelvic exam and Pap test. ?Hepatitis B test. ?Hepatitis C test. ?HIV (human immunodeficiency virus) test. ?STI (sexually transmitted infection) testing, if you are at risk. ?Lung cancer screening. ?Colorectal cancer screening. ?Mammogram. Talk with your health care provider about  when you should start having regular mammograms. This may depend on whether you have a family history of breast cancer. ?BRCA-related cancer screening. This may be done if you have a family history of breast, ovarian, tubal, or peritoneal cancers. ?Bone density scan. This is done to screen for osteoporosis. ?Talk with your health care provider about your test results, treatment options, and if necessary, the need for  more tests. ?Follow these instructions at home: ?Eating and drinking ? ?Eat a diet that includes fresh fruits and vegetables, whole grains, lean protein, and low-fat dairy products. ?Take vitamin and mineral supplements as recommended by your health care provider. ?Do not drink alcohol if: ?Your health care provider tells you not to drink. ?You are pregnant, may be pregnant, or are planning to become pregnant. ?If you drink alcohol: ?Limit how much you have to 0-1 drink a day. ?Know how much alcohol is in your drink. In the [place] one drink equals one 12 oz bottle of beer (355 mL), one 5 oz glass of wine (148 mL), or one 1? oz glass of hard liquor (44 mL). ?Lifestyle ?Brush your teeth every morning and night with fluoride toothpaste. Floss one time each day. ?Exercise for at least 30 minutes 5 or more days each week. ?Do not use any products that contain nicotine or tobacco. These products include cigarettes, chewing tobacco, and vaping devices, such as e-cigarettes. If you need help quitting, ask your health care provider. ?Do not use drugs. ?If you are sexually active, practice safe sex. Use a condom or other form of protection to prevent STIs. ?If you do not wish to become pregnant, use a form of birth control. If you plan to become pregnant, see your health care provider for a prepregnancy visit. ?Take aspirin only as told by your health care provider. Make sure that you understand how much to take and what form to take. Work with your health care provider to find out whether it is safe and beneficial for you to take aspirin daily. ?Find healthy ways to manage stress, such as: ?Meditation, yoga, or listening to music. ?Journaling. ?Talking to a trusted person. ?Spending time with friends and family. ?Minimize exposure to UV radiation to reduce your risk of skin cancer. ?Safety ?Always wear your seat belt while driving or riding in a vehicle. ?Do not drive: ?If you have been drinking alcohol. Do not ride with  someone who has been drinking. ?When you are tired or distracted. ?While texting. ?If you have been using any mind-altering substances or drugs. ?Wear a helmet and other protective equipment during sports activities. ?If you have firearms in your house, make sure you follow all gun safety procedures. ?Seek help if you have been physically or sexually abused. ?What's next? ?Visit your health care provider once a year for an annual wellness visit. ?Ask your health care provider how often you should have your eyes and teeth checked. ?Stay up to date on all vaccines. ?This information is not intended to replace advice given to you by your health care provider. Make sure you discuss any questions you have with your health care provider. ?Document Revised: 03/20/2021 Document Reviewed: 03/20/2021 ?Elsevier Patient Education ? [place]. ? ?

## 2021-12-30 NOTE — Progress Notes (Signed)
? ?Chief Complaint:  ?[name] is a 46 y.o. female who presents today for her annual comprehensive physical exam.   ? ?Assessment/Plan:  ?New/Acute Problems: ?Left Wrist Pain ?No red flags.  Positive Wynn Maudlin may have small de Quervain's tenosynovitis.  We discussed home exercises and handout was given.  May consider referral to sports medicine if not improving. ? ?Chronic Problems Addressed Today: ?Morbid obesity ([name]) ?We discussed lifestyle modifications.  She has not had much success with this.  She is interested in possibly starting medication for this.  We discussed potential options.  We will start Ozempic 0.25 mg weekly for 4 weeks and then increase to 0.5 mg weekly.  We discussed potential side effects.  She will follow-up in a few weeks via MyChart. ? ?Prediabetes ?A1c is pending.  We will start Ozempic 0.25 mg weekly for 4 weeks and then increase to 0.5 mg weekly.  We will need to recheck A1c in 6 to 12 months. ? ?Preventative Healthcare: ?Due for Pap next year.  Had colonoscopy last year will need repeat in 5 years.  Check labs today. ? ?Patient Counseling(The following topics were reviewed and/or handout was given): ? -Nutrition: Stressed importance of moderation in sodium/caffeine intake, saturated fat and cholesterol, caloric balance, sufficient intake of fresh fruits, vegetables, and fiber. ? -Stressed the importance of regular exercise.  ? -Substance Abuse: Discussed cessation/primary prevention of tobacco, alcohol, or other drug use; driving or other dangerous activities under the influence; availability of treatment for abuse.  ? -Injury prevention: Discussed safety belts, safety helmets, smoke detector, smoking near bedding or upholstery.  ? -Sexuality: Discussed sexually transmitted diseases, partner selection, use of condoms, avoidance of unintended pregnancy and contraceptive alternatives.  ? -Dental health: Discussed importance of regular tooth brushing, flossing, and dental  visits. ? -Health maintenance and immunizations reviewed. Please refer to Health maintenance section. ? ?Return to care in 1 year for next preventative visit.  ? ?  ?Subjective:  ?HPI: ? ?She has no acute complaints today.  ? ?Patient has been having issues with her left hand for the last few months. Hurts with certain motions. Comes and goes. Hard to pick up certain objects. Works in [employer]. No obvious injuries or precipitating events. No treatments tried. No numbness or tingling.  ? ?Lifestyle ?Diet: Trying to cut down on portion sizes ?Exercise: Limited.  ? ? ?  12/30/2021  ?  1:56 PM  ?Depression screen PHQ 2/9  ?Decreased Interest 0  ?Down, Depressed, Hopeless 0  ?PHQ - 2 Score 0  ? ? ?Health Maintenance Due  ?Topic Date Due  ? HIV Screening  Never done  ? Hepatitis C Screening  Never done  ?  ? ?ROS: Per HPI, otherwise a complete review of systems was negative.  ? ?PMH: ? ?The following were reviewed and entered/updated in epic: ?Past Medical History:  ?Diagnosis Date  ? Preterm labor   ? ?Patient Active Problem List  ? Diagnosis Date Noted  ? Prediabetes 12/30/2021  ? Morbid obesity ([place]) 12/30/2021  ? ?Past Surgical History:  ?Procedure Laterality Date  ? CESAREAN SECTION    ? CYST EXCISION    ? ? ?Family History  ?Problem Relation Age of Onset  ? Hypertension Mother   ? Colon cancer Father   ? Cancer Father   ?     colon cancer  ? Diabetes Father   ? Colon polyps Sister   ? Diabetes Sister   ? Gout Sister   ? Esophageal cancer Neg  Hx   ? Rectal cancer Neg Hx   ? Stomach cancer Neg Hx   ? ? ?Medications- reviewed and updated ?Current Outpatient Medications  ?Medication Sig Dispense Refill  ? Multiple Vitamin (MULTIVITAMIN) tablet Take 1 tablet by mouth daily.    ? Semaglutide,0.25 or 0.'5MG'$ /DOS, (OZEMPIC, 0.25 OR 0.5 MG/DOSE,) 2 MG/1.5ML SOPN Inject 0.25 mg into the skin once a week. 1.5 mL 0  ? ?No current facility-administered medications for this visit.  ? ? ?Allergies-reviewed and updated ?Allergies   ?Allergen Reactions  ? Diflucan [Fluconazole]   ?  Rash   ? ? ?Social History  ? ?Socioeconomic History  ? Marital status: Married  ?  Spouse name: Not on file  ? Number of children: Not on file  ? Years of education: Not on file  ? Highest education level: Not on file  ?Occupational History  ? Not on file  ?Tobacco Use  ? Smoking status: Never  ? Smokeless tobacco: Never  ?Substance and Sexual Activity  ? Alcohol use: Yes  ?  Comment: occasional  ? Drug use: No  ? Sexual activity: Yes  ?  Birth control/protection: None  ?Other Topics Concern  ? Not on file  ?Social History Narrative  ? Not on file  ? ?Social Determinants of Health  ? ?Financial Resource Strain: Not on file  ?Food Insecurity: Not on file  ?Transportation Needs: Not on file  ?Physical Activity: Not on file  ?Stress: Not on file  ?Social Connections: Not on file  ? ?   ?  ?Objective:  ?Physical Exam: ?BP 117/84 (BP Location: Left Arm)   Pulse 100   Temp 98.2 ?F (36.8 ?C) (Temporal)   Ht 4' 11.75" (1.518 m)   Wt 240 lb (108.9 kg)   LMP 12/07/2021 (Exact Date)   SpO2 99%   BMI 47.26 kg/m?   ?Body mass index is 47.26 kg/m?. ?Wt Readings from Last 3 Encounters:  ?12/30/21 240 lb (108.9 kg)  ?07/10/21 239 lb (108.4 kg)  ?06/25/21 239 lb (108.4 kg)  ? ?Gen: NAD, resting comfortably ?HEENT: TMs normal bilaterally. OP clear. No thyromegaly noted.  ?CV: RRR with no murmurs appreciated ?Pulm: NWOB, CTAB with no crackles, wheezes, or rhonchi ?GI: Normal bowel sounds present. Soft, Nontender, Nondistended. ?MSK: no edema, cyanosis, or clubbing noted ?Skin: warm, dry ?Neuro: CN2-12 grossly intact. Strength 5/5 in upper and lower extremities. Reflexes symmetric and intact bilaterally.  ?Psych: Normal affect and thought content ?   ? ?Algis Greenhouse. [name], MD ?12/30/2021 2:27 PM  ?

## 2021-12-30 NOTE — Telephone Encounter (Signed)
Patient stated [facility] needs a prior auth for ozempic - pt would like a call once this has been started  ?

## 2021-12-30 NOTE — Assessment & Plan Note (Signed)
A1c is pending.  We will start Ozempic 0.25 mg weekly for 4 weeks and then increase to 0.5 mg weekly.  We will need to recheck A1c in 6 to 12 months. ?

## 2021-12-30 NOTE — Assessment & Plan Note (Signed)
We discussed lifestyle modifications.  She has not had much success with this.  She is interested in possibly starting medication for this.  We discussed potential options.  We will start Ozempic 0.25 mg weekly for 4 weeks and then increase to 0.5 mg weekly.  We discussed potential side effects.  She will follow-up in a few weeks via MyChart. ?

## 2021-12-30 NOTE — Telephone Encounter (Signed)
Patient stated [facility] on file does not take her [employer]. -  ? ?Patient would like her medications to be sent to [facility] in [place]-  ?Phone [phone] ? ? ?

## 2021-12-30 NOTE — Telephone Encounter (Signed)
Rx sent to [facility] per patient request  ?

## 2021-12-31 LAB — TSH: TSH: 4.09 u[IU]/mL (ref 0.35–5.50)

## 2021-12-31 NOTE — Telephone Encounter (Signed)
[facility] has a denied request on file for [facility] for this member ?Patient notified, advise to call insurance for coverage ?Letter of cancellation was send to be scan on patient chart   ?

## 2021-12-31 NOTE — Progress Notes (Signed)
Please inform patient of the following: ? ?Blood sugar and cholesterol are both borderline elevated. I hope these will improve with weight loss. We can recheck in 1 year. ? ?Algis Greenhouse. [name], MD ?12/31/2021 8:38 AM  ?

## 2021-12-31 NOTE — Telephone Encounter (Signed)
PA (Key: [id number]) ?Ozempic (0.25 or 0.5 MG/DOSE) '2MG'$ /1.5ML pen-injectors ?Waiting for determination  ?

## 2021-12-31 NOTE — Telephone Encounter (Signed)
She has prediabetes. We need to have her on something. Do they have a list of alternatives? ? ?Algis Greenhouse. [name], MD ?12/31/2021 2:18 PM  ? ?

## 2021-12-31 NOTE — Telephone Encounter (Signed)
PA requested  x2 today  ?

## 2022-01-07 ENCOUNTER — Ambulatory Visit: Payer: Managed Care, Other (non HMO)

## 2022-01-07 NOTE — Telephone Encounter (Signed)
Pt was looking to see if there was an update on the PA for Ozempic. Was she denied due to the dosage? They recently changed from 1.5 ML to 3.0 ML pen injectors. ?

## 2022-01-07 NOTE — Telephone Encounter (Signed)
Patient aware, Rx denied x 2 ?Advice to call insurance for alternatives  ?

## 2022-01-09 ENCOUNTER — Encounter: Payer: Self-pay | Admitting: Family Medicine

## 2022-01-14 ENCOUNTER — Other Ambulatory Visit: Payer: Self-pay

## 2022-01-14 MED ORDER — TRULICITY 0.75 MG/0.5ML ~~LOC~~ SOAJ: 0.7500 mg | SUBCUTANEOUS | 0 refills | Status: DC

## 2022-01-14 NOTE — Telephone Encounter (Signed)
Rx sent to pharmacy and patient advised. Pt verbalized understanding ?

## 2022-01-14 NOTE — Telephone Encounter (Signed)
Ok to send in Trulicity 9.98 mg weekly for 4 weeks.  She should check in with this and we can discuss increasing the dose as tolerated. ?

## 2022-01-14 NOTE — Telephone Encounter (Signed)
Please advise, looks like according to the notes in chart Ozempic was requested 2 times and denied.  ?

## 2022-02-06 NOTE — Telephone Encounter (Signed)
I appreciate the update. Recommend increasing to 1.'5mg'$  weekly if she is agreeable. I would like for her to check back in with [place] in a few weeks with her new dose. ? ?Algis Greenhouse. [name], MD ?02/06/2022 2:19 PM  ? ?

## 2022-02-06 NOTE — Telephone Encounter (Signed)
FYI

## 2022-02-07 ENCOUNTER — Other Ambulatory Visit: Payer: Self-pay

## 2022-02-07 MED ORDER — TRULICITY 1.5 MG/0.5ML ~~LOC~~ SOAJ: 1.5000 mg | SUBCUTANEOUS | 1 refills | Status: DC

## 2022-04-03 ENCOUNTER — Other Ambulatory Visit: Payer: Self-pay | Admitting: *Deleted

## 2022-04-03 ENCOUNTER — Telehealth: Payer: Self-pay | Admitting: Family Medicine

## 2022-04-03 MED ORDER — TRULICITY 1.5 MG/0.5ML ~~LOC~~ SOAJ
1.5000 mg | SUBCUTANEOUS | 1 refills | Status: DC
Start: 2022-04-03 — End: 2022-05-02

## 2022-04-03 NOTE — Telephone Encounter (Signed)
Pt states she did feel appetite suppression when she began the 0.75 mg dosage.   Pt states she has not felt a difference of appetite suppression after dosage increase to 1.'5mg'$ . Otherwise she is feeling well.    LAST APPOINTMENT DATE:   12/30/21 CPE  NEXT APPOINTMENT DATE: 12/31/21 CPE   MEDICATION: Dulaglutide (TRULICITY) 1.5 GA/8.9KS SOPN [[id number]]     Is the patient out of medication?  States she has one injection left, to be administered "this weekend " 07/01 or 07/02  PHARMACY: [facility], [address] AT [address] [place], [place] [address]  Phone:  [phone]  Fax:  [phone]

## 2022-04-03 NOTE — Telephone Encounter (Signed)
Rx send in  

## 2022-05-01 ENCOUNTER — Encounter: Payer: Self-pay | Admitting: Family Medicine

## 2022-05-02 ENCOUNTER — Other Ambulatory Visit: Payer: Self-pay | Admitting: *Deleted

## 2022-05-02 MED ORDER — TRULICITY 3 MG/0.5ML ~~LOC~~ SOAJ: 3.0000 mg | SUBCUTANEOUS | 0 refills | Status: DC

## 2022-05-02 NOTE — Telephone Encounter (Signed)
Ok to increase to '3mg'$  weekly.  Algis Greenhouse. [name], MD 05/02/2022 11:21 AM

## 2022-05-02 NOTE — Telephone Encounter (Signed)
Please advise 

## 2022-05-14 ENCOUNTER — Telehealth: Payer: Self-pay | Admitting: Family Medicine

## 2022-05-14 NOTE — Telephone Encounter (Signed)
Patient states RX for Trulicity was sent to wrong Pharmacy.  Patient states that she feels more comfortable not increasing dosage (3 mg) of Trulicity just yet.  Patient requests RX for Trulicity 1.5 mg be sent to:   [facility], [address] AT [address] [place] Phone:  [phone]  Fax:  [phone]

## 2022-05-15 ENCOUNTER — Other Ambulatory Visit: Payer: Self-pay | Admitting: *Deleted

## 2022-05-15 MED ORDER — TRULICITY 3 MG/0.5ML ~~LOC~~ SOAJ: 3.0000 mg | SUBCUTANEOUS | 0 refills | Status: DC

## 2022-05-15 NOTE — Telephone Encounter (Signed)
Rx was send to [place]

## 2022-06-14 ENCOUNTER — Other Ambulatory Visit: Payer: Self-pay | Admitting: *Deleted

## 2022-06-14 MED ORDER — TRULICITY 3 MG/0.5ML ~~LOC~~ SOAJ: 3.0000 mg | SUBCUTANEOUS | 0 refills | Status: DC

## 2022-06-30 ENCOUNTER — Encounter: Payer: Self-pay | Admitting: *Deleted

## 2022-07-11 ENCOUNTER — Other Ambulatory Visit: Payer: Self-pay | Admitting: Family Medicine

## 2022-07-14 MED ORDER — TRULICITY 3 MG/0.5ML ~~LOC~~ SOAJ: 3.0000 mg | SUBCUTANEOUS | 0 refills | Status: DC

## 2022-07-21 ENCOUNTER — Ambulatory Visit (INDEPENDENT_AMBULATORY_CARE_PROVIDER_SITE_OTHER): Payer: Commercial Managed Care - HMO | Admitting: Family Medicine

## 2022-07-21 ENCOUNTER — Encounter: Payer: Self-pay | Admitting: Family Medicine

## 2022-07-21 DIAGNOSIS — R7303 Prediabetes: Secondary | ICD-10-CM

## 2022-07-21 DIAGNOSIS — Z23 Encounter for immunization: Secondary | ICD-10-CM | POA: Diagnosis not present

## 2022-07-21 DIAGNOSIS — H6690 Otitis media, unspecified, unspecified ear: Secondary | ICD-10-CM | POA: Diagnosis not present

## 2022-07-21 MED ORDER — TRULICITY 4.5 MG/0.5ML ~~LOC~~ SOAJ: 4.5000 mg | SUBCUTANEOUS | 5 refills | Status: DC

## 2022-07-21 MED ORDER — AMOXICILLIN-POT CLAVULANATE 875-125 MG PO TABS: 1.0000 | ORAL_TABLET | Freq: Two times a day (BID) | ORAL | 0 refills | Status: DC

## 2022-07-21 NOTE — Assessment & Plan Note (Signed)
Last A1c was at goal.  Will increase Trulicity to 4.5 mg weekly to follow-up with weight control as well as above.  We can recheck A1c with next office visit.

## 2022-07-21 NOTE — Assessment & Plan Note (Signed)
Down about 12 pounds since last visit though feels like she plateaued on Trulicity.  Will increase dose to 4.5 mg weekly.  She will follow-up in a few weeks via MyChart.

## 2022-07-21 NOTE — Progress Notes (Signed)
   [name] is a 46 y.o. female who presents today for an office visit.  Assessment/Plan:  New/Acute Problems: Otitis Media / Sinusitis  Start Augmentin.  She can continue over-the-counter meds.  Encouraged hydration.  She will let us know if not improving.  Chronic Problems Addressed Today: Morbid obesity (HCC) Down about 12 pounds since last visit though feels like she plateaued on Trulicity.  Will increase dose to 4.5 mg weekly.  She will follow-up in a few weeks via MyChart.  Prediabetes Last A1c was at goal.  Will increase Trulicity to 4.5 mg weekly to follow-up with weight control as well as above.  We can recheck A1c with next office visit.  Flu shot given today.    Subjective:  HPI:  Patient here with nasal congestion. Started a few weeks ago. Some sinus pressure. She has had some ear pain the last few days. OTc medications have not given much improvement. Son has been sick with similar symptoms. No fevers or chills.        Objective:  Physical Exam: BP 123/85   Pulse 75   Temp 97.7 F (36.5 C) (Temporal)   Ht '4\' 11"'$  (1.499 m)   Wt 228 lb 9.6 oz (103.7 kg)   LMP 06/06/2022   SpO2 100%   BMI 46.17 kg/m   Wt Readings from Last 3 Encounters:  07/21/22 228 lb 9.6 oz (103.7 kg)  12/30/21 240 lb (108.9 kg)  07/10/21 239 lb (108.4 kg)    Gen: No acute distress, resting comfortably HEENT: Left TM with clear effusion.  Right TM erythematous and bulging CV: Regular rate and rhythm with no murmurs appreciated Pulm: Normal work of breathing, clear to auscultation bilaterally with no crackles, wheezes, or rhonchi Neuro: Grossly normal, moves all extremities Psych: Normal affect and thought content      [name] M. [name], MD 07/21/2022 12:04 PM

## 2022-07-21 NOTE — Patient Instructions (Signed)
It was very nice to see you today!  You have an ear infection please start the Augmentin.  We will increase your Trulicity to 4.5 mg weekly.  Send me a message in a few weeks to let me know how you are doing.  Let me know if your pain is not improving.  Take care, Dr [name]  PLEASE NOTE:  If you had any lab tests please let us know if you have not heard back within a few days. You may see your results on mychart before we have a chance to review them but we will give you a call once they are reviewed by [place]. If we ordered any referrals today, please let us know if you have not heard from their office within the next week.   Please try these tips to maintain a healthy lifestyle:  Eat at least 3 REAL meals and 1-2 snacks per day.  Aim for no more than 5 hours between eating.  If you eat breakfast, please do so within one hour of getting up.   Each meal should contain half fruits/vegetables, one quarter protein, and one quarter carbs (no bigger than a computer mouse)  Cut down on sweet beverages. This includes juice, soda, and sweet tea.   Drink at least 1 glass of water with each meal and aim for at least 8 glasses per day  Exercise at least 150 minutes every week.

## 2022-07-28 ENCOUNTER — Encounter: Payer: Self-pay | Admitting: Family Medicine

## 2022-07-28 NOTE — Telephone Encounter (Signed)
Spoke with patient  Patient stated still have a few more days to finish antibiotic  Advise patient to continue with antibiotic, if is no changes schedule OV with PCP for further evaluation

## 2022-08-01 ENCOUNTER — Ambulatory Visit (INDEPENDENT_AMBULATORY_CARE_PROVIDER_SITE_OTHER): Payer: Commercial Managed Care - HMO | Admitting: Family Medicine

## 2022-08-01 ENCOUNTER — Encounter: Payer: Self-pay | Admitting: Family Medicine

## 2022-08-01 DIAGNOSIS — R7303 Prediabetes: Secondary | ICD-10-CM

## 2022-08-01 DIAGNOSIS — J329 Chronic sinusitis, unspecified: Secondary | ICD-10-CM | POA: Diagnosis not present

## 2022-08-01 MED ORDER — AZITHROMYCIN 250 MG PO TABS: ORAL_TABLET | ORAL | 0 refills | Status: DC

## 2022-08-01 NOTE — Patient Instructions (Signed)
It was very nice to see you today!  Please start the azithromycin.  Let me know if not improving by next week and we can try prednisone.  Take care, Dr [name]  PLEASE NOTE:  If you had any lab tests please let us know if you have not heard back within a few days. You may see your results on mychart before we have a chance to review them but we will give you a call once they are reviewed by [place]. If we ordered any referrals today, please let us know if you have not heard from their office within the next week.   Please try these tips to maintain a healthy lifestyle:  Eat at least 3 REAL meals and 1-2 snacks per day.  Aim for no more than 5 hours between eating.  If you eat breakfast, please do so within one hour of getting up.   Each meal should contain half fruits/vegetables, one quarter protein, and one quarter carbs (no bigger than a computer mouse)  Cut down on sweet beverages. This includes juice, soda, and sweet tea.   Drink at least 1 glass of water with each meal and aim for at least 8 glasses per day  Exercise at least 150 minutes every week.

## 2022-08-01 NOTE — Assessment & Plan Note (Signed)
Increasing Trulicity as above.  We will check A1c next office visit.

## 2022-08-01 NOTE — Progress Notes (Signed)
   [name] is a 46 y.o. female who presents today for an office visit.  Assessment/Plan:  New/Acute Problems: Sinusitis / Otitis Media Has had some improvement though still has persistent symptoms despite Augmentin.  We discussed watchful waiting versus trial of a different antibiotic.  We will try azithromycin which should hopefully cover the organisms that were not covered by the Augmentin.  She can continue good oral hydration.  Can continue over-the-counter meds.  We also discussed prednisone however she deferred for now.  She will let me know if not improving by next week.  We will consider prednisone burst versus ENT referral at that time.  Chronic Problems Addressed Today: Morbid obesity ([place]) She has not been able to start the higher dose of Trulicity yet due to just having the 3.0 mg dose filled.  She will continue this for another few weeks and then go to the 4.5 mg dose.  She will follow-up in a few weeks via MyChart to let me know how she is doing on the higher dose.  Prediabetes Increasing Trulicity as above.  We will check A1c next office visit.     Subjective:  HPI:  Patient here for follow up.  She was seen here 11 days ago.  Diagnosed with otitis media and sinusitis.  We started Augmentin.  This did not help. Still  has a lot of congestion and drainage. No fevers or chills.         Objective:  Physical Exam: BP 118/81   Pulse 72   Temp 97.8 F (36.6 C) (Temporal)   Ht '4\' 11"'$  (1.499 m)   Wt 231 lb 6.4 oz (105 kg)   LMP 06/06/2022   SpO2 98%   BMI 46.74 kg/m   Gen: No acute distress, resting comfortably CV: Regular rate and rhythm with no murmurs appreciated Pulm: Normal work of breathing, clear to auscultation bilaterally with no crackles, wheezes, or rhonchi Neuro: Grossly normal, moves all extremities Psych: Normal affect and thought content      Kyndell Zeiser M. [name], MD 08/01/2022 8:49 AM

## 2022-08-01 NOTE — Assessment & Plan Note (Addendum)
She has not been able to start the higher dose of Trulicity yet due to just having the 3.0 mg dose filled.  She will continue this for another few weeks and then go to the 4.5 mg dose.  She will follow-up in a few weeks via MyChart to let me know how she is doing on the higher dose.

## 2022-08-16 LAB — HM MAMMOGRAPHY

## 2022-09-01 ENCOUNTER — Encounter: Payer: Self-pay | Admitting: Family Medicine

## 2022-11-08 ENCOUNTER — Telehealth: Payer: Self-pay

## 2022-11-10 NOTE — Telephone Encounter (Signed)
Patient notified Rx Trulicity not cover by insurance  Advise to contact insurance for medication coverage

## 2022-11-12 NOTE — Telephone Encounter (Signed)
Patient states:  - She contacted insurance and was told medication would have been covered if a question was marked yes  - The question was "Is this medication used to treat FDA approved or Compendia?"  - Insurance told her this is a common mistake   Patient is requesting PA be resubmitted with corrected answer. A good callback number to have PA done is [phone]

## 2022-11-16 DIAGNOSIS — Z6841 Body Mass Index (BMI) 40.0 and over, adult: Secondary | ICD-10-CM | POA: Diagnosis not present

## 2022-11-16 DIAGNOSIS — M79671 Pain in right foot: Secondary | ICD-10-CM | POA: Diagnosis not present

## 2022-11-16 DIAGNOSIS — R030 Elevated blood-pressure reading, without diagnosis of hypertension: Secondary | ICD-10-CM | POA: Diagnosis not present

## 2022-11-17 ENCOUNTER — Encounter: Payer: Self-pay | Admitting: Family Medicine

## 2022-11-17 NOTE — Telephone Encounter (Signed)
Pt would like a call back on the process of RX Trulicity. Please call pt back with details.

## 2022-11-18 NOTE — Telephone Encounter (Signed)
Pharmacy Patient Advocate Encounter  Received notification from [place] that the request for prior authorization for Trulicity has been denied due to .    Please be advised we currently do not have a Pharmacist to review denials, therefore you will need to process appeals accordingly as needed. Thanks for your support at this time.

## 2022-11-19 NOTE — Telephone Encounter (Signed)
Trulicity denied. Covered alternatives sent to PCP

## 2022-11-21 NOTE — Telephone Encounter (Signed)
Ok to send in alternative. Do we have a list from the insurance?  Algis Greenhouse. [name], MD 11/21/2022 3:35 PM

## 2022-12-16 ENCOUNTER — Other Ambulatory Visit (HOSPITAL_COMMUNITY): Payer: Self-pay

## 2022-12-16 ENCOUNTER — Telehealth: Payer: Self-pay

## 2022-12-16 NOTE — Telephone Encounter (Signed)
Pharmacy Patient Advocate Encounter   Received notification that prior authorization for Trulicity 4.'5mg'$ /0.54m is required/requested.  Per Test Claim: Prior authorization required. [facility]. Drug requires prior authorization    PA submitted on 12/16/22 to (ins) [employer] via CoverMyMeds Key # [id number] is pending

## 2022-12-17 NOTE — Telephone Encounter (Signed)
Denied on February 13 Your PA request has been denied Patient notified

## 2023-01-01 ENCOUNTER — Encounter: Payer: Managed Care, Other (non HMO) | Admitting: Family Medicine

## 2023-02-04 ENCOUNTER — Encounter: Payer: Self-pay | Admitting: Family Medicine

## 2023-02-04 ENCOUNTER — Other Ambulatory Visit (HOSPITAL_COMMUNITY)
Admission: RE | Admit: 2023-02-04 | Discharge: 2023-02-04 | Disposition: A | Discharge status: Home / Self Care | Payer: 59 | Source: Ambulatory Visit | Attending: Family Medicine | Admitting: Family Medicine

## 2023-02-04 ENCOUNTER — Ambulatory Visit (INDEPENDENT_AMBULATORY_CARE_PROVIDER_SITE_OTHER): Payer: 59 | Admitting: Family Medicine

## 2023-02-04 VITALS — BP 118/80 | HR 69 | Temp 97.8°F | Ht 59.0 in | Wt 241.2 lb

## 2023-02-04 DIAGNOSIS — R7303 Prediabetes: Secondary | ICD-10-CM

## 2023-02-04 DIAGNOSIS — Z13220 Encounter for screening for lipoid disorders: Secondary | ICD-10-CM

## 2023-02-04 DIAGNOSIS — Z0001 Encounter for general adult medical examination with abnormal findings: Secondary | ICD-10-CM | POA: Diagnosis not present

## 2023-02-04 DIAGNOSIS — M79673 Pain in unspecified foot: Secondary | ICD-10-CM

## 2023-02-04 DIAGNOSIS — Z124 Encounter for screening for malignant neoplasm of cervix: Secondary | ICD-10-CM | POA: Insufficient documentation

## 2023-02-04 LAB — COMPREHENSIVE METABOLIC PANEL WITH GFR
ALT: 9 U/L (ref 0–35)
AST: 14 U/L (ref 0–37)
Albumin: 3.7 g/dL (ref 3.5–5.2)
Alkaline Phosphatase: 52 U/L (ref 39–117)
BUN: 15 mg/dL (ref 6–23)
CO2: 25 meq/L (ref 19–32)
Calcium: 8.6 mg/dL (ref 8.4–10.5)
Chloride: 100 meq/L (ref 96–112)
Creatinine, Ser: 0.57 mg/dL (ref 0.40–1.20)
GFR: 108.76 mL/min
Glucose, Bld: 107 mg/dL — ABNORMAL HIGH (ref 70–99)
Potassium: 4 meq/L (ref 3.5–5.1)
Sodium: 133 meq/L — ABNORMAL LOW (ref 135–145)
Total Bilirubin: 0.7 mg/dL (ref 0.2–1.2)
Total Protein: 7.3 g/dL (ref 6.0–8.3)

## 2023-02-04 LAB — LIPID PANEL
Cholesterol: 181 mg/dL (ref 0–200)
HDL: 49 mg/dL (ref >39.00)
LDL Cholesterol: 111 mg/dL — ABNORMAL HIGH (ref 0–99)
NonHDL: 132.2
Total CHOL/HDL Ratio: 4
Triglycerides: 104 mg/dL (ref 0.0–149.0)
VLDL: 20.8 mg/dL (ref 0.0–40.0)

## 2023-02-04 LAB — TSH: TSH: 5.65 u[IU]/mL — ABNORMAL HIGH (ref 0.35–5.50)

## 2023-02-04 LAB — CBC
HCT: 38.4 % (ref 36.0–46.0)
Hemoglobin: 13 g/dL (ref 12.0–15.0)
MCHC: 33.9 g/dL (ref 30.0–36.0)
MCV: 83.6 fl (ref 78.0–100.0)
Platelets: 251 10*3/uL (ref 150.0–400.0)
RBC: 4.6 Mil/uL (ref 3.87–5.11)
RDW: 15 % (ref 11.5–15.5)
WBC: 6.6 10*3/uL (ref 4.0–10.5)

## 2023-02-04 LAB — HEMOGLOBIN A1C: Hgb A1c MFr Bld: 6.1 % (ref 4.6–6.5)

## 2023-02-04 NOTE — Assessment & Plan Note (Signed)
Check A1c.  Discussed lifestyle modifications.  Would benefit from GLP-1 agonist such as Mounjaro and if sugars are in diabetic range.

## 2023-02-04 NOTE — Patient Instructions (Signed)
It was very nice to see you today!  We will check blood work today.  We performed a Pap smear today.  I will refer you to see the foot doctor for your foot.  Return in about 1 year (around 02/04/2024) for Annual Physical.   Take care, Dr [name]  PLEASE NOTE:  If you had any lab tests, please let us know if you have not heard back within a few days. You may see your results on mychart before we have a chance to review them but we will give you a call once they are reviewed by [place].   If we ordered any referrals today, please let us know if you have not heard from their office within the next week.   If you had any urgent prescriptions sent in today, please check with the pharmacy within an hour of our visit to make sure the prescription was transmitted appropriately.   Please try these tips to maintain a healthy lifestyle:  Eat at least 3 REAL meals and 1-2 snacks per day.  Aim for no more than 5 hours between eating.  If you eat breakfast, please do so within one hour of getting up.   Each meal should contain half fruits/vegetables, one quarter protein, and one quarter carbs (no bigger than a computer mouse)  Cut down on sweet beverages. This includes juice, soda, and sweet tea.   Drink at least 1 glass of water with each meal and aim for at least 8 glasses per day  Exercise at least 150 minutes every week.    Preventive Care 44-49 Years Old, Female Preventive care refers to lifestyle choices and visits with your health care provider that can promote health and wellness. Preventive care visits are also called wellness exams. What can I expect for my preventive care visit? Counseling Your health care provider may ask you questions about your: Medical history, including: Past medical problems. Family medical history. Pregnancy history. Current health, including: Menstrual cycle. Method of birth control. Emotional well-being. Home life and relationship well-being. Sexual  activity and sexual health. Lifestyle, including: Alcohol, nicotine or tobacco, and drug use. Access to firearms. Diet, exercise, and sleep habits. Work and work Astronomer. Sunscreen use. Safety issues such as seatbelt and bike helmet use. Physical exam Your health care provider will check your: Height and weight. These may be used to calculate your BMI (body mass index). BMI is a measurement that tells if you are at a healthy weight. Waist circumference. This measures the distance around your waistline. This measurement also tells if you are at a healthy weight and may help predict your risk of certain diseases, such as type 2 diabetes and high blood pressure. Heart rate and blood pressure. Body temperature. Skin for abnormal spots. What immunizations do I need?  Vaccines are usually given at various ages, according to a schedule. Your health care provider will recommend vaccines for you based on your age, medical history, and lifestyle or other factors, such as travel or where you work. What tests do I need? Screening Your health care provider may recommend screening tests for certain conditions. This may include: Lipid and cholesterol levels. Diabetes screening. This is done by checking your blood sugar (glucose) after you have not eaten for a while (fasting). Pelvic exam and Pap test. Hepatitis B test. Hepatitis C test. HIV (human immunodeficiency virus) test. STI (sexually transmitted infection) testing, if you are at risk. Lung cancer screening. Colorectal cancer screening. Mammogram. Talk with your health care  provider about when you should start having regular mammograms. This may depend on whether you have a family history of breast cancer. BRCA-related cancer screening. This may be done if you have a family history of breast, ovarian, tubal, or peritoneal cancers. Bone density scan. This is done to screen for osteoporosis. Talk with your health care provider about your  test results, treatment options, and if necessary, the need for more tests. Follow these instructions at home: Eating and drinking  Eat a diet that includes fresh fruits and vegetables, whole grains, lean protein, and low-fat dairy products. Take vitamin and mineral supplements as recommended by your health care provider. Do not drink alcohol if: Your health care provider tells you not to drink. You are pregnant, may be pregnant, or are planning to become pregnant. If you drink alcohol: Limit how much you have to 0-1 drink a day. Know how much alcohol is in your drink. In the [place] one drink equals one 12 oz bottle of beer (355 mL), one 5 oz glass of wine (148 mL), or one 1 oz glass of hard liquor (44 mL). Lifestyle Brush your teeth every morning and night with fluoride toothpaste. Floss one time each day. Exercise for at least 30 minutes 5 or more days each week. Do not use any products that contain nicotine or tobacco. These products include cigarettes, chewing tobacco, and vaping devices, such as e-cigarettes. If you need help quitting, ask your health care provider. Do not use drugs. If you are sexually active, practice safe sex. Use a condom or other form of protection to prevent STIs. If you do not wish to become pregnant, use a form of birth control. If you plan to become pregnant, see your health care provider for a prepregnancy visit. Take aspirin only as told by your health care provider. Make sure that you understand how much to take and what form to take. Work with your health care provider to find out whether it is safe and beneficial for you to take aspirin daily. Find healthy ways to manage stress, such as: Meditation, yoga, or listening to music. Journaling. Talking to a trusted person. Spending time with friends and family. Minimize exposure to UV radiation to reduce your risk of skin cancer. Safety Always wear your seat belt while driving or riding in a vehicle. Do not  drive: If you have been drinking alcohol. Do not ride with someone who has been drinking. When you are tired or distracted. While texting. If you have been using any mind-altering substances or drugs. Wear a helmet and other protective equipment during sports activities. If you have firearms in your house, make sure you follow all gun safety procedures. Seek help if you have been physically or sexually abused. What's next? Visit your health care provider once a year for an annual wellness visit. Ask your health care provider how often you should have your eyes and teeth checked. Stay up to date on all vaccines. This information is not intended to replace advice given to you by your health care provider. Make sure you discuss any questions you have with your health care provider. Document Revised: 03/20/2021 Document Reviewed: 03/20/2021 Elsevier Patient Education  2023 [employer].

## 2023-02-04 NOTE — Assessment & Plan Note (Signed)
BMI 48 today.  We discussed lifestyle modifications.  Had previously been doing well with GLP-1 agonist however insurance stopped paying for this.  We are checking labs today.  May be able to restart depending on results.

## 2023-02-04 NOTE — Progress Notes (Signed)
Chief Complaint:  [name] is a 47 y.o. female who presents today for her annual comprehensive physical exam.    Assessment/Plan:  New/Acute Problems: Right Foot Pain She has significant localized pain along base of fifth metatarsal. May have arthritis or other degenerative changes however concern for possible stress reaction given that she is on her feet for most of the day.  Given that symptoms have been persistent for the last several months will place referral to sports medicine for further evaluation and management.  She can use Tylenol and/ibuprofen as needed.  Chronic Problems Addressed Today: Morbid obesity (HCC) BMI 48 today.  We discussed lifestyle modifications.  Had previously been doing well with GLP-1 agonist however insurance stopped paying for this.  We are checking labs today.  May be able to restart depending on results.  Prediabetes Check A1c.  Discussed lifestyle modifications.  Would benefit from GLP-1 agonist such as Mounjaro and if sugars are in diabetic range.  Preventative Healthcare: Check labs. Pap smear performed today.   Patient Counseling(The following topics were reviewed and/or handout was given):  -Nutrition: Stressed importance of moderation in sodium/caffeine intake, saturated fat and cholesterol, caloric balance, sufficient intake of fresh fruits, vegetables, and fiber.  -Stressed the importance of regular exercise.   -Substance Abuse: Discussed cessation/primary prevention of tobacco, alcohol, or other drug use; driving or other dangerous activities under the influence; availability of treatment for abuse.   -Injury prevention: Discussed safety belts, safety helmets, smoke detector, smoking near bedding or upholstery.   -Sexuality: Discussed sexually transmitted diseases, partner selection, use of condoms, avoidance of unintended pregnancy and contraceptive alternatives.   -Dental health: Discussed importance of regular tooth brushing,  flossing, and dental visits.  -Health maintenance and immunizations reviewed. Please refer to Health maintenance section.  Return to care in 1 year for next preventative visit.     Subjective:  HPI:  She has no acute complaints today.   She has also been having some right foot pain. This has been going on for a few months. She went to urgent care and they told her she may have a strain. Was told to use different shoes which did not help. Pain is constant but worse in the evening.   Lifestyle Diet: None specific. Trying to cut down on sweets. Exercise: Gets plenty of steps in at work.      02/04/2023    7:43 AM  Depression screen PHQ 2/9  Decreased Interest 0  Down, Depressed, Hopeless 0  PHQ - 2 Score 0    Health Maintenance Due  Topic Date Due   DTaP/Tdap/Td (1 - Tdap) Never done   PAP SMEAR-Modifier  08/02/2018     ROS: Per HPI, otherwise a complete review of systems was negative.   PMH:  The following were reviewed and entered/updated in epic: Past Medical History:  Diagnosis Date   Preterm labor    Patient Active Problem List   Diagnosis Date Noted   Prediabetes 12/30/2021   Morbid obesity (HCC) 12/30/2021   Past Surgical History:  Procedure Laterality Date   CESAREAN SECTION     CYST EXCISION      Family History  Problem Relation Age of Onset   Hypertension Mother    Colon cancer Father    Cancer Father        colon cancer   Diabetes Father    Colon polyps Sister    Diabetes Sister    Gout Sister    Esophageal cancer Neg Hx  Rectal cancer Neg Hx    Stomach cancer Neg Hx     Medications- reviewed and updated Current Outpatient Medications  Medication Sig Dispense Refill   Multiple Vitamin (MULTIVITAMIN) tablet Take 1 tablet by mouth daily.     No current facility-administered medications for this visit.    Allergies-reviewed and updated Allergies  Allergen Reactions   Diflucan [Fluconazole]     Rash     Social History    Socioeconomic History   Marital status: Married    Spouse name: Not on file   Number of children: Not on file   Years of education: Not on file   Highest education level: Not on file  Occupational History   Not on file  Tobacco Use   Smoking status: Never   Smokeless tobacco: Never  Substance and Sexual Activity   Alcohol use: Yes    Comment: occasional   Drug use: No   Sexual activity: Yes    Birth control/protection: None  Other Topics Concern   Not on file  Social History Narrative   Not on file   Social Determinants of Health   Financial Resource Strain: Not on file  Food Insecurity: Not on file  Transportation Needs: Not on file  Physical Activity: Not on file  Stress: Not on file  Social Connections: Not on file        Objective:  Physical Exam: BP 118/80   Pulse 69   Temp 97.8 F (36.6 C) (Temporal)   Ht 4\' 11"  (1.499 m)   Wt 241 lb 3.2 oz (109.4 kg)   LMP 11/28/2022   SpO2 99%   BMI 48.72 kg/m   Body mass index is 48.72 kg/m. Wt Readings from Last 3 Encounters:  02/04/23 241 lb 3.2 oz (109.4 kg)  08/01/22 231 lb 6.4 oz (105 kg)  07/21/22 228 lb 9.6 oz (103.7 kg)   Gen: NAD, resting comfortably HEENT: TMs normal bilaterally. OP clear. No thyromegaly noted.  CV: RRR with no murmurs appreciated Pulm: NWOB, CTAB with no crackles, wheezes, or rhonchi GI: Normal bowel sounds present. Soft, Nontender, Nondistended. GU: Normal female genitalia.  Chaperone present for exam. MSK:  - right Foot: No deformities.  Neurovascular intact distally.  Tenderness on palpation of the base of fifth metatarsal Skin: warm, dry Neuro: CN2-12 grossly intact. Strength 5/5 in upper and lower extremities. Reflexes symmetric and intact bilaterally.  Psych: Normal affect and thought content     Derrico Zhong M. [name], MD 02/04/2023 8:23 AM

## 2023-02-06 NOTE — Progress Notes (Unsigned)
   Rubin Payor, PhD, LAT, ATC acting as a scribe for Clementeen Graham, MD.  Subjective:    CC: R foot pain  HPI: Pt is a 47 y/o female c/o R foot pain ongoing for several months. No MOI. She notes increased work hours in Dec. 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 Pt locates pain to the lateral aspect of her R foot, along the 5th MT. She feels the pain more after a day of work, when at rest. She predominately wears crocs for work and tried switching to [employer].   R foot swelling: yes Aggravates: none Treatments tried: switching shoes  Pertinent review of Systems: No fevers or chills  Relevant historical information: Prediabetes and obesity.   Objective:    Vitals:   02/09/23 0920  BP: (!) 142/88  Pulse: 87  SpO2: 98%   General: Well Developed, well nourished, and in no acute distress.   MSK: Right foot pes planus. Normal foot and ankle motion. Intact strength. Mildly tender palpation along fifth metatarsal.  Lab and Radiology Results  X-ray images right foot obtained today personally and independently interpreted No acute fractures.  No severe degenerative changes.  No stress fracture is visible at the fifth metatarsal per my read. Await formal radiology review    Impression and Recommendations:    Assessment and Plan: 47 y.o. female with left lateral foot pain.  Thought to be stress reaction versus overuse.  Plan for good supportive shoes.  Will try this at [facility] feet.  Also will try an arch strap.  If needed consider postop shoe or even CAM Walker boot. Recheck in a month.  She will bring her shoes with her on that visit so that we can assess or modify insoles if needed.Marland Kitchen  PDMP not reviewed this encounter. Orders Placed This Encounter  Procedures   DG Foot Complete Right    Standing Status:   Future    Number of Occurrences:   1    Standing Expiration Date:   02/09/2024    Order Specific Question:   Reason for Exam (SYMPTOM  OR DIAGNOSIS  REQUIRED)    Answer:   eval foot pain 5th metatasal    Order Specific Question:   Is patient pregnant?    Answer:   No    Order Specific Question:   Preferred imaging location?    Answer:   [facility]   No orders of the defined types were placed in this encounter.   Discussed warning signs or symptoms. Please see discharge instructions. Patient expresses understanding.   The above documentation has been reviewed and is accurate and complete Clementeen Graham, M.D.

## 2023-02-07 LAB — CYTOLOGY - PAP
Comment: NEGATIVE
Diagnosis: NEGATIVE
Diagnosis: REACTIVE
High risk HPV: NEGATIVE

## 2023-02-09 ENCOUNTER — Ambulatory Visit: Payer: 59 | Admitting: Family Medicine

## 2023-02-09 ENCOUNTER — Ambulatory Visit (INDEPENDENT_AMBULATORY_CARE_PROVIDER_SITE_OTHER): Payer: 59

## 2023-02-09 VITALS — BP 142/88 | HR 87 | Ht 59.0 in | Wt 241.0 lb

## 2023-02-09 DIAGNOSIS — M79671 Pain in right foot: Secondary | ICD-10-CM

## 2023-02-09 NOTE — Patient Instructions (Addendum)
Thank you for coming in today.   Try getting good supportive shoes from [employer]. Consider good insoles with them.   If needed ok to use cam walker boot or post op shoe.   Also use arch straps.   Recheck in 1 month. Bring your shoes.   Please get an Xray today before you leave

## 2023-02-10 NOTE — Progress Notes (Signed)
Blood pressure and cholesterol both borderline but better than last year.  She should continue to work on diet and exercise and we can recheck this in a year.   Her sodium and thyroid numbers are off just a little bit.  I would like for her to come back to recheck.  Please place future order for TSH, free T4, free T3, and BMET.  Her Pap smear was normal.  We can recheck this again in 5 years.

## 2023-02-11 ENCOUNTER — Other Ambulatory Visit: Payer: Self-pay

## 2023-02-11 ENCOUNTER — Other Ambulatory Visit (INDEPENDENT_AMBULATORY_CARE_PROVIDER_SITE_OTHER): Payer: 59

## 2023-02-11 DIAGNOSIS — E039 Hypothyroidism, unspecified: Secondary | ICD-10-CM

## 2023-02-11 DIAGNOSIS — E871 Hypo-osmolality and hyponatremia: Secondary | ICD-10-CM

## 2023-02-11 LAB — BASIC METABOLIC PANEL
BUN: 13 mg/dL (ref 6–23)
CO2: 25 mEq/L (ref 19–32)
Calcium: 8.3 mg/dL — ABNORMAL LOW (ref 8.4–10.5)
Chloride: 103 mEq/L (ref 96–112)
Creatinine, Ser: 0.6 mg/dL (ref 0.40–1.20)
GFR: 107.41 mL/min (ref >60.00)
Glucose, Bld: 110 mg/dL — ABNORMAL HIGH (ref 70–99)
Potassium: 3.8 mEq/L (ref 3.5–5.1)
Sodium: 137 mEq/L (ref 135–145)

## 2023-02-11 LAB — TSH: TSH: 4.02 u[IU]/mL (ref 0.35–5.50)

## 2023-02-11 LAB — T4, FREE: Free T4: 0.84 ng/dL (ref 0.60–1.60)

## 2023-02-11 LAB — T3, FREE: T3, Free: 3.6 pg/mL (ref 2.3–4.2)

## 2023-02-12 NOTE — Progress Notes (Signed)
Her sodium and thyroid levels are back to normal.  Do not need to do any other testing at this point.  We can recheck everything again in a year.

## 2023-02-13 NOTE — Progress Notes (Signed)
Right foot xray shows a heel spur but otherwise the x-ray looks okay.  No normalities where you are experiencing pain in the forefoot.

## 2023-03-11 NOTE — Telephone Encounter (Signed)
Patient states she spoke to insurance about trulicity PA denial and they informed her to try Wegovy, Mounjaro, or Ozempic. Pt states she wants to try whichever med PCP thinks would be best.

## 2023-03-11 NOTE — Telephone Encounter (Signed)
Please advise 

## 2023-03-12 ENCOUNTER — Other Ambulatory Visit: Payer: Self-pay | Admitting: Family Medicine

## 2023-03-12 ENCOUNTER — Other Ambulatory Visit: Payer: Self-pay | Admitting: *Deleted

## 2023-03-12 MED ORDER — TIRZEPATIDE 5 MG/0.5ML ~~LOC~~ SOAJ: 5.0000 mg | SUBCUTANEOUS | 0 refills | Status: DC

## 2023-03-12 NOTE — Telephone Encounter (Signed)
Rx send to pharmacy  

## 2023-03-12 NOTE — Telephone Encounter (Signed)
Ok to send in [place] 0.5 mg weekly for 4 weeks.  We can then increase every 4 weeks as tolerated.   Katina Degree. [name], MD 03/12/2023 12:21 PM

## 2023-03-13 ENCOUNTER — Other Ambulatory Visit: Payer: Self-pay | Admitting: Family Medicine

## 2023-03-13 NOTE — Telephone Encounter (Signed)
Pharmacy comment: Alternative Requested:NOT COVERED BY INSURANCE.

## 2023-03-13 NOTE — Telephone Encounter (Signed)
Please let patient know. Per her last message, though Trulicity would be covered.  [name]. [name], MD 03/13/2023 7:43 AM

## 2023-03-16 NOTE — Telephone Encounter (Signed)
Pharmacy comment: Alternative Requested:NON FORMULARY.

## 2023-03-17 NOTE — Telephone Encounter (Signed)
She will have to check with insurance to see what is covered.  Katina Degree. [name], MD 03/17/2023 9:57 AM

## 2023-03-20 NOTE — Telephone Encounter (Signed)
Caller states she faxed over the PA via cover my meds for mounjaro. In case of verbal PA being preferred you can call number below:    [facility] Prior Authorization Line: [phone]

## 2023-03-23 ENCOUNTER — Telehealth: Payer: Self-pay

## 2023-03-23 ENCOUNTER — Other Ambulatory Visit (HOSPITAL_COMMUNITY): Payer: Self-pay

## 2023-03-23 NOTE — Telephone Encounter (Signed)
Patient Advocate Encounter   Received notification from [employer] that prior authorization is required for [facility] 5MG /0.5ML pen-injectors   Submitted: 03-23-2023 Key [id number]  Status is pending

## 2023-03-31 NOTE — Telephone Encounter (Signed)
Patient Advocate Encounter  Received a fax from [employer] regarding Prior Authorization for Mounjaro 5MG /0.5ML pen-injectors.   Key: [id number]  Authorization has been DENIED due to

## 2023-03-31 NOTE — Telephone Encounter (Signed)
Patient tried Trulicity in the past

## 2023-04-01 NOTE — Telephone Encounter (Signed)
Please let PA team know. Katina Degree. [name], MD 04/01/2023 7:19 AM

## 2023-04-01 NOTE — Telephone Encounter (Signed)
Dr. [name], pt must try both Victoza and Trulicity. I do not see where pt has been on Victoza. Please advise.

## 2023-04-02 NOTE — Telephone Encounter (Signed)
We can send in Victoza 0.6 mg daily for 7 days, then increase to 1.2 daily for 7 days, then increase to 1.8 mg daily.

## 2023-04-03 ENCOUNTER — Other Ambulatory Visit: Payer: Self-pay | Admitting: Family Medicine

## 2023-04-03 MED ORDER — VICTOZA 18 MG/3ML ~~LOC~~ SOPN: PEN_INJECTOR | SUBCUTANEOUS | 1 refills | Status: DC

## 2023-04-03 NOTE — Addendum Note (Signed)
Addended by: Jimmye Norman on: 04/03/2023 09:30 AM   Modules accepted: Orders

## 2023-04-03 NOTE — Telephone Encounter (Signed)
Spoke to pt told her Rx for Victoza was sent to pharmacy due to insurance would not cover  Mounjaro. Pt verbalized understanding.

## 2023-05-13 ENCOUNTER — Ambulatory Visit: Payer: 59 | Admitting: Family

## 2023-05-13 ENCOUNTER — Encounter: Payer: Self-pay | Admitting: Family

## 2023-05-13 VITALS — BP 126/86 | HR 83 | Temp 97.1°F | Ht 59.0 in | Wt 246.0 lb

## 2023-05-13 DIAGNOSIS — R21 Rash and other nonspecific skin eruption: Secondary | ICD-10-CM

## 2023-05-13 MED ORDER — HYDROXYZINE HCL 10 MG PO TABS
10.0000 mg | ORAL_TABLET | Freq: Three times a day (TID) | ORAL | 0 refills | Status: DC | PRN
Start: 2023-05-13 — End: 2023-08-13

## 2023-05-13 NOTE — Progress Notes (Signed)
   Patient ID: [name], female    DOB: 20-Dec-1975, 47 y.o.   MRN: [id number]  Chief Complaint  Patient presents with   Rash    sx for 1 week    HPI:      Skin rash:  Pt c/o very itchy rash on arms and legs for about a week after being at [employer]. She has pinpoint bumps all on her upper arms and fewer on both legs, areas exposed to the sun, unsure if allergic to the sun as it happens every year they go to the beach. States she was on the beach for 1 day only and used sunscreen. Rash started after they came back home.      Assessment & Plan:  1. Skin rash- refilling hydroxyzine, advised on use & SE, pt has had in past. Advised to apply equal parts Triamcinolone cream with body cream and apply to skin for itching. Pt states she has this at home.  - hydrOXYzine (ATARAX) 10 MG tablet; Take 1-2 tablets (10-20 mg total) by mouth 3 (three) times daily as needed for itching.  Dispense: 30 tablet; Refill: 0  Subjective:    Outpatient Medications Prior to Visit  Medication Sig Dispense Refill   liraglutide (VICTOZA) 18 MG/3ML SOPN INJECT 0.6MG  DOSE UNDER THE SKIN DAILY FOR 7 DAYS, THEN 1.2MG  DAILY FOR 7 DAYS, THEN 1.8MG  DAILY (Patient not taking: Reported on 05/13/2023) 6 mL 1   Multiple Vitamin (MULTIVITAMIN) tablet Take 1 tablet by mouth daily. (Patient not taking: Reported on 05/13/2023)     No facility-administered medications prior to visit.   Past Medical History:  Diagnosis Date   Preterm labor    Past Surgical History:  Procedure Laterality Date   CESAREAN SECTION     CYST EXCISION     Allergies  Allergen Reactions   Diflucan [Fluconazole]     Rash       Objective:    Physical Exam Vitals and nursing note reviewed.  Constitutional:      Appearance: Normal appearance.  Cardiovascular:     Rate and Rhythm: Normal rate and regular rhythm.  Pulmonary:     Effort: Pulmonary effort is normal.     Breath sounds: Normal breath sounds.  Musculoskeletal:         General: Normal range of motion.  Skin:    General: Skin is warm and dry.     Findings: Rash present. Rash is urticarial (pinpoint skin colored bumps on upper arms and lower legs).  Neurological:     Mental Status: She is alert.  Psychiatric:        Mood and Affect: Mood normal.        Behavior: Behavior normal.    BP 126/86   Pulse 83   Temp (!) 97.1 F (36.2 C) (Temporal)   Ht 4\' 11"  (1.499 m)   Wt 246 lb (111.6 kg)   LMP 02/08/2023   SpO2 99%   Breastfeeding No   BMI 49.69 kg/m  Wt Readings from Last 3 Encounters:  05/13/23 246 lb (111.6 kg)  02/09/23 241 lb (109.3 kg)  02/04/23 241 lb 3.2 oz (109.4 kg)       Dulce Sellar, NP

## 2023-05-17 ENCOUNTER — Encounter: Payer: Self-pay | Admitting: Family Medicine

## 2023-05-18 ENCOUNTER — Other Ambulatory Visit: Payer: Self-pay | Admitting: *Deleted

## 2023-05-18 MED ORDER — METFORMIN HCL 500 MG PO TABS: 500.0000 mg | ORAL_TABLET | Freq: Every day | ORAL | 3 refills | Status: DC

## 2023-05-18 NOTE — Telephone Encounter (Signed)
Ok to send in metformin 500 mg daily and have her follow up here in a few weeks.  [name]. [name], MD 05/18/2023 12:09 PM

## 2023-05-18 NOTE — Telephone Encounter (Signed)
See note

## 2023-07-05 DIAGNOSIS — H66001 Acute suppurative otitis media without spontaneous rupture of ear drum, right ear: Secondary | ICD-10-CM | POA: Diagnosis not present

## 2023-07-05 DIAGNOSIS — Z681 Body mass index (BMI) 19 or less, adult: Secondary | ICD-10-CM | POA: Diagnosis not present

## 2023-07-05 DIAGNOSIS — Z23 Encounter for immunization: Secondary | ICD-10-CM | POA: Diagnosis not present

## 2023-07-17 ENCOUNTER — Encounter: Payer: Self-pay | Admitting: Family

## 2023-07-17 ENCOUNTER — Ambulatory Visit: Payer: 59 | Admitting: Family

## 2023-07-17 VITALS — BP 117/81 | HR 77 | Temp 98.0°F | Ht 59.0 in | Wt 249.1 lb

## 2023-07-17 DIAGNOSIS — H938X1 Other specified disorders of right ear: Secondary | ICD-10-CM | POA: Diagnosis not present

## 2023-07-17 NOTE — Progress Notes (Signed)
Patient ID: [name], female    DOB: May 29, 1976, 47 y.o.   MRN: [id number]  Chief Complaint  Patient presents with   Ear Pain    Pt c/o right ear infection 2 weeks ago. Pt was seen at [facility] and was given amoxicillin for 10 days, finished on Tuesday. Right ear is still muffled but not painful.    *Discussed the use of AI scribe software for clinical note transcription with the patient, who gave verbal consent to proceed.  History of Present Illness   The patient, with a history of recurrent ear infections, presents with persistent muffled hearing in the right ear. She recently completed a course of Augmentin prescribed by a [facility] for a suspected ear infection. She denies ear pain but reports a history of annual ear infections typically coinciding with the onset of cold weather. She denies a significant cold prior to this episode but reports that the [facility] provider noted significant fluid in the ear. She denies known allergies but questions if she may have some undiagnosed. She also reports a history of frequent ear infections during childhood.     Assessment & Plan:     Right Ear  congestion - Resolving infection with persistent muffled hearing. No pain or tenderness. Tympanic membrane appears scarred and retracted, but not inflamed or bulging. No signs of pus or mucus. -Consider over-the-counter Flonase or Nasacort to decrease inflammation. -Advised it can take up to 3 weeks after sinus sx or ear infection for congestion to resolve. -If no improvement or worsening symptoms in two weeks, patient to contact office for possible referral to Ear, Nose, and Throat specialist.      Subjective:    Outpatient Medications Prior to Visit  Medication Sig Dispense Refill   amoxicillin-clavulanate (AUGMENTIN) 875-125 MG tablet Take 1 tablet by mouth 2 (two) times daily.     metFORMIN (GLUCOPHAGE) 500 MG tablet Take 1 tablet (500 mg total) by mouth daily with breakfast. 30 tablet  3   hydrOXYzine (ATARAX) 10 MG tablet Take 1-2 tablets (10-20 mg total) by mouth 3 (three) times daily as needed for itching. (Patient not taking: Reported on 07/17/2023) 30 tablet 0   liraglutide (VICTOZA) 18 MG/3ML SOPN INJECT 0.6MG  DOSE UNDER THE SKIN DAILY FOR 7 DAYS, THEN 1.2MG  DAILY FOR 7 DAYS, THEN 1.8MG  DAILY (Patient not taking: Reported on 07/17/2023) 6 mL 1   Multiple Vitamin (MULTIVITAMIN) tablet Take 1 tablet by mouth daily. (Patient not taking: Reported on 07/17/2023)     No facility-administered medications prior to visit.   Past Medical History:  Diagnosis Date   Preterm labor    Past Surgical History:  Procedure Laterality Date   CESAREAN SECTION     CYST EXCISION     Allergies  Allergen Reactions   Diflucan [Fluconazole]     Rash       Objective:    Physical Exam Vitals and nursing note reviewed.  Constitutional:      Appearance: Normal appearance.  HENT:     Right Ear: Decreased hearing noted. No drainage or tenderness. Tympanic membrane is scarred and retracted (w/mild erythema in canal). Tympanic membrane is not erythematous.     Left Ear: Tympanic membrane and ear canal normal.  Cardiovascular:     Rate and Rhythm: Normal rate and regular rhythm.  Pulmonary:     Effort: Pulmonary effort is normal.     Breath sounds: Normal breath sounds.  Musculoskeletal:        General: Normal  range of motion.  Skin:    General: Skin is warm and dry.  Neurological:     Mental Status: She is alert.  Psychiatric:        Mood and Affect: Mood normal.        Behavior: Behavior normal.   BP 117/81 (BP Location: Left Arm, Patient Position: Sitting, Cuff Size: Normal)   Pulse 77   Temp 98 F (36.7 C) (Temporal)   Ht 4\' 11"  (1.499 m)   Wt 249 lb 2 oz (113 kg)   SpO2 100%   BMI 50.32 kg/m  Wt Readings from Last 3 Encounters:  07/17/23 249 lb 2 oz (113 kg)  05/13/23 246 lb (111.6 kg)  02/09/23 241 lb (109.3 kg)       Dulce Sellar, NP

## 2023-08-13 ENCOUNTER — Ambulatory Visit: Payer: 59 | Admitting: Family Medicine

## 2023-08-13 VITALS — BP 131/88 | HR 69 | Temp 98.1°F | Ht 59.0 in | Wt 253.2 lb

## 2023-08-13 DIAGNOSIS — M79671 Pain in right foot: Secondary | ICD-10-CM | POA: Diagnosis not present

## 2023-08-13 DIAGNOSIS — Z23 Encounter for immunization: Secondary | ICD-10-CM

## 2023-08-13 DIAGNOSIS — H9011 Conductive hearing loss, unilateral, right ear, with unrestricted hearing on the contralateral side: Secondary | ICD-10-CM | POA: Diagnosis not present

## 2023-08-13 MED ORDER — MELOXICAM 15 MG PO TABS
15.0000 mg | ORAL_TABLET | Freq: Every day | ORAL | 0 refills | Status: DC
Start: 2023-08-13 — End: 2024-04-01

## 2023-08-13 NOTE — Patient Instructions (Signed)
It was very nice to see you today!  I think you have dead skin and earwax buildup in your ear.  Please use the Debrox daily until you can see ENT.  Take the meloxicam and work on the exercises for your foot.  Let us know if not improving.  Please let us know if we can help with your weight loss medications.   Return if symptoms worsen or fail to improve.   Take care, Dr [name]  PLEASE NOTE:  If you had any lab tests, please let us know if you have not heard back within a few days. You may see your results on mychart before we have a chance to review them but we will give you a call once they are reviewed by [place].   If we ordered any referrals today, please let us know if you have not heard from their office within the next week.   If you had any urgent prescriptions sent in today, please check with the pharmacy within an hour of our visit to make sure the prescription was transmitted appropriately.   Please try these tips to maintain a healthy lifestyle:  Eat at least 3 REAL meals and 1-2 snacks per day.  Aim for no more than 5 hours between eating.  If you eat breakfast, please do so within one hour of getting up.   Each meal should contain half fruits/vegetables, one quarter protein, and one quarter carbs (no bigger than a computer mouse)  Cut down on sweet beverages. This includes juice, soda, and sweet tea.   Drink at least 1 glass of water with each meal and aim for at least 8 glasses per day  Exercise at least 150 minutes every week.

## 2023-08-13 NOTE — Progress Notes (Signed)
   [name] is a 47 y.o. female who presents today for an office visit.  Assessment/Plan:  New/Acute Problems: Right Ear Cerumen impaction  Not able to be fully irrigated today. Did not attempt instrument debridement due to proximity to tympanic membrane.  Recommended against qtip use.  Recommended trial of over-the-counter Debrox though will refer to ENT for debridement.  We discussed reasons to return to care.  Right Foot Pain Consistent with plantar fasciitis.  We discussed importance of good foot wear.  We discussed home exercises and handout was given.  Will try a 2-week course of meloxicam.  She will need to follow-up with sports medicine if not improving.  Chronic Problems Addressed Today: Morbid obesity (HCC) BMI 51 today.  We discussed lifestyle modifications.  She had previously been on GLP-1 agonist however insurance would no longer pay for this.  She may be interested in pay for this out-of-pocket.  She will let us know if she is interested in this and we can send in if needed.     Subjective:  HPI:  See Assessment / plan for status of chronic conditions. Patient here with right ear muffled hearing.  She was seen by a different provider a few weeks ago and was started on Augmentin. Symptoms did not improve. She came here a week or so later and was seen by a different provider. She was started on Flonase. Symptoms still have not resolved.  She has not had any pain.  No drainage.  Just feels like hearing is muffled and has decreased hearing on the right side.  No other treatments tried.  She does admit to using Q-tips routinely to help clear out her ears.   She is still getting pain on her right heel.  This has been going on for several months.  She did see sports medicine for this a few months ago.  They recommended changing footwear which has helped however still has significant painwith first few steps of the day.  This improves throughout the day.       Objective:   Physical Exam: BP 131/88   Pulse 69   Temp 98.1 F (36.7 C) (Temporal)   Ht 4\' 11"  (1.499 m)   Wt 253 lb 3.2 oz (114.9 kg)   LMP 08/02/2023   SpO2 99%   BMI 51.14 kg/m    Gen: No acute distress, resting comfortably HEENT:  Left TM clear.  Right TM partially obscured by debris.  Pulm: Normal work of breathing, clear to auscultation bilaterally with no crackles, wheezes, or rhonchi Neuro: Grossly normal, moves all extremities Psych: Normal affect and thought content      Jaime Williams M. [name], MD 08/13/2023 9:05 AM

## 2023-08-13 NOTE — Addendum Note (Signed)
Addended by: Dyann Kief on: 08/13/2023 10:52 AM   Modules accepted: Orders

## 2023-08-13 NOTE — Assessment & Plan Note (Signed)
BMI 51 today.  We discussed lifestyle modifications.  She had previously been on GLP-1 agonist however insurance would no longer pay for this.  She may be interested in pay for this out-of-pocket.  She will let us know if she is interested in this and we can send in if needed.

## 2023-08-18 ENCOUNTER — Encounter (INDEPENDENT_AMBULATORY_CARE_PROVIDER_SITE_OTHER): Payer: Self-pay | Admitting: Otolaryngology

## 2023-08-19 ENCOUNTER — Telehealth (INDEPENDENT_AMBULATORY_CARE_PROVIDER_SITE_OTHER): Payer: Self-pay | Admitting: Otolaryngology

## 2023-08-19 NOTE — Telephone Encounter (Signed)
Referral call, appointment 09/18/23 @ 11:00 am. Gave patient correct location of [address] [facility] [address].

## 2023-08-31 LAB — HM MAMMOGRAPHY

## 2023-09-01 ENCOUNTER — Encounter: Payer: Self-pay | Admitting: Family Medicine

## 2023-09-18 ENCOUNTER — Institutional Professional Consult (permissible substitution) (INDEPENDENT_AMBULATORY_CARE_PROVIDER_SITE_OTHER): Payer: 59

## 2023-10-08 ENCOUNTER — Telehealth: Payer: Self-pay | Admitting: *Deleted

## 2023-10-08 NOTE — Telephone Encounter (Signed)
 Copied from CRM [id number]. Topic: Clinical - Medication Refill >> Oct 08, 2023  2:12 PM Tiffany H wrote: Most Recent Primary Care Visit:  Provider: KENNYTH WORTH HERO  Department: [facility]  Visit Type: OFFICE VISIT  Date: 08/13/2023  Medication: Victoza  liraglutide  (VICTOZA ) 18 MG/3ML SOPN  INJECT 0.6MG  DOSE UNDER THE SKIN DAILY FOR 7 DAYS, THEN 1.2MG  DAILY FOR 7 DAYS, THEN 1.8MG  DAILY,   Patient recently changed insurances and would like to begin management using related medications. Please assist.   Has the patient contacted their pharmacy? Yes (Agent: If no, request that the patient contact the pharmacy for the refill. If patient does not wish to contact the pharmacy document the reason why and proceed with request.) (Agent: If yes, when and what did the pharmacy advise?)  Is this the correct pharmacy for this prescription? Yes If no, delete pharmacy and type the correct one.  This is the patient's preferred pharmacy:   [id number] [place], [place] - [address] AT [facility] [address] [facility] [address] [facility] [address] [place] [address] Phone: [phone] Fax: [phone]  Has the prescription been filled recently? No  Is the patient out of the medication? Yes  Has the patient been seen for an appointment in the last year OR does the patient have an upcoming appointment? Yes  Can we respond through MyChart? Yes  Agent: Please be advised that Rx refills may take up to 3 business days. We ask that you follow-up with your pharmacy.

## 2023-10-10 ENCOUNTER — Encounter: Payer: Self-pay | Admitting: Family Medicine

## 2023-10-12 NOTE — Telephone Encounter (Signed)
**Note De-identified  [place] Obfuscation** Please advise 

## 2023-10-12 NOTE — Telephone Encounter (Signed)
 Ok with me. Please place any necessary orders.

## 2023-10-12 NOTE — Telephone Encounter (Signed)
 Ok to send in [place] 2.5 mg weekly for 4 weeks. Recommend she schedule an appointment in a few weeks for follow up.  [name]. [name], MD 10/12/2023 1:04 PM

## 2023-10-13 ENCOUNTER — Other Ambulatory Visit: Payer: Self-pay | Admitting: *Deleted

## 2023-10-13 MED ORDER — TIRZEPATIDE 2.5 MG/0.5ML ~~LOC~~ SOAJ: 2.5000 mg | SUBCUTANEOUS | 1 refills | Status: DC

## 2023-10-13 NOTE — Telephone Encounter (Signed)
 Copied from CRM [id number]. Topic: Clinical - Medication Question >> Oct 13, 2023 12:31 PM Franky GRADE wrote: Reason for CRM: Patient called pharmacy to pick up her Mounjaro  2.5 mg; however, pharmacy told patient that they have not received the script. Patient was hoping we can call [facility] and provide them with a verbal order.   Rx send to pharmacy  [facility]

## 2023-10-14 ENCOUNTER — Other Ambulatory Visit: Payer: Self-pay | Admitting: *Deleted

## 2023-10-14 MED ORDER — LIRAGLUTIDE 18 MG/3ML ~~LOC~~ SOPN: 0.6000 mg | PEN_INJECTOR | Freq: Every day | SUBCUTANEOUS | 1 refills | Status: DC

## 2023-10-14 NOTE — Telephone Encounter (Signed)
 Rx send to pharmacy

## 2023-10-14 NOTE — Telephone Encounter (Signed)
 Copied from CRM [id number]. Topic: Clinical - Prescription Issue >> Oct 13, 2023  2:34 PM Evie B wrote: Reason for CRM: pt called states the provider needs to call in a prior auth for her medication tirzepatide  (MOUNJARO ) 2.5 MG/0.5ML Pen. Please call the insurance company [employer] at [phone]. Pt will not be able to get the medication without the prior auth.   Please start PA Thanks [facility]

## 2023-10-15 ENCOUNTER — Other Ambulatory Visit (HOSPITAL_COMMUNITY): Payer: Self-pay

## 2023-10-15 ENCOUNTER — Telehealth: Payer: Self-pay

## 2023-10-15 NOTE — Telephone Encounter (Signed)
 PA request has been Submitted. New Encounter created for follow up. For additional info see Pharmacy Prior Auth telephone encounter from 10/15/2023.

## 2023-10-15 NOTE — Telephone Encounter (Signed)
*  Primary  Pharmacy Patient Advocate Encounter   Received notification from Patient Advice Request messages that prior authorization for Mounjaro  2.5MG /0.5ML auto-injectors  is required/requested.   Insurance verification completed.   The patient is insured through [employer] .   Per test claim: PA required; PA submitted to above mentioned insurance via CoverMyMeds Key/confirmation #/EOC [id number] Status is pending

## 2023-10-15 NOTE — Telephone Encounter (Signed)
 Pharmacy Patient Advocate Encounter  Received notification from [employer] that Prior Authorization for Mounjaro  2.5mg  has been DENIED.  Full denial letter will be uploaded to the media tab. See denial reason below.   PA #/Case ID/Reference #: Based on the information provided, I am unable to approve coverage for this medication because: ? There is no indication that the patient has type 2 diabetes mellitus. Any other use is  considered experimental, investigational or unproven, including the following (this list may not be all inclusive): 1. Weight Loss Treatment; 2. Type 1 Diabetes Mellitus; 3.  Prediabetes/Diabetes Prevention; 4. Metabolic Syndrome. Documentation is required to support this information. ? Liraglutide  subcutaneous injection, Mounjaro , Ozempic  and Victoza  are considered medically necessary for FDA-Approved Indication - Type 2 Diabetes Mellitus. Approve for 1 year if the patient meets all of the following (A, B, and C): A) Diagnosis of Type 2 diabetes mellitus [Documentation Required]; B) One of the following (i, ii, iii, or iv): i. Patient has tried one metformin -containing product for a minimum of 8 weeks; ii. Intolerance to metformin  despite appropriate dose titration duration (for example, period of 8 weeks); iii. Contraindication to metformin  (for example, acute/chronic metabolic acidosis, severe renal dysfunction); iv. Patient has established atherosclerotic CVD (ASCVD) or indicators of high ASCVD risk, or chronic kidney disease (CKD); C) Preferred product criteria is met for the  product(s) as listed - Liraglutide : Patient has tried Trulicity  (dulaglutide ) [may require prior authorization]; Mounjaro : Documented failure, contraindication or intolerance to Trulicity  (dulaglutide ) [may require prior authorization]; Ozempic : Documented failure, contraindication or intolerance to Trulicity  (dulaglutide ) [may require prior authorization]; Victoza : Documented contraindication or intolerance to  Trulicity  (dulaglutide ) [may require prior authorization]. When coverage is available and medically necessary, the dosage,  frequency, duration of therapy, and site of care should be reasonable, clinically appropriate, and supported by evidence-based literature and adjusted based upon severity, alternative available treatments, and previous response to therapy. Receipt of sample product does  not satisfy any criteria requirements for coverage. Any other use is considered  experimental, investigational, or unproven, including the following (this list may not be all inclusive; criteria will be updated as new published data are available): 1. Weight Loss Treatment. 2. Type 1 Diabetes Mellitus. 3. Prediabetes/Diabetes Prevention. 4. Metabolic Syndrome. 5. Concomitant Use with Glucagon-Like Peptide-1 (GLP-1) Agonists or GLP-1/ Glucose-Dependent Insulinotropic Polypeptide (GIP) Agonist. Documentation is required for use of liraglutide , Mounjaro , Ozempic  and Victoza  as noted in the criteria as [documentation  required]. Documentation may include, but is not limited to, chart notes, laboratory results, claims record, and/or other information.

## 2023-10-19 NOTE — Telephone Encounter (Signed)
 Left message to return call to our office at their convenience.

## 2023-10-20 NOTE — Telephone Encounter (Signed)
 Patient aware Rx not approved by insurance  Stated spoke with insurance provider, notified her no other medication will be cover

## 2023-11-09 ENCOUNTER — Ambulatory Visit: Payer: Commercial Managed Care - HMO | Admitting: Family Medicine

## 2024-04-01 ENCOUNTER — Encounter: Payer: Self-pay | Admitting: Family Medicine

## 2024-04-01 ENCOUNTER — Ambulatory Visit (INDEPENDENT_AMBULATORY_CARE_PROVIDER_SITE_OTHER): Admitting: Family Medicine

## 2024-04-01 VITALS — BP 118/85 | HR 82 | Temp 97.7°F | Ht 59.0 in | Wt 231.4 lb

## 2024-04-01 DIAGNOSIS — Z0001 Encounter for general adult medical examination with abnormal findings: Secondary | ICD-10-CM

## 2024-04-01 DIAGNOSIS — R7303 Prediabetes: Secondary | ICD-10-CM | POA: Diagnosis not present

## 2024-04-01 DIAGNOSIS — Z13220 Encounter for screening for lipoid disorders: Secondary | ICD-10-CM | POA: Diagnosis not present

## 2024-04-01 DIAGNOSIS — R7989 Other specified abnormal findings of blood chemistry: Secondary | ICD-10-CM

## 2024-04-01 DIAGNOSIS — Z131 Encounter for screening for diabetes mellitus: Secondary | ICD-10-CM

## 2024-04-01 DIAGNOSIS — Z1159 Encounter for screening for other viral diseases: Secondary | ICD-10-CM

## 2024-04-01 DIAGNOSIS — Z114 Encounter for screening for human immunodeficiency virus [HIV]: Secondary | ICD-10-CM | POA: Diagnosis not present

## 2024-04-01 LAB — CBC
HCT: 42.1 % (ref 36.0–46.0)
Hemoglobin: 13.9 g/dL (ref 12.0–15.0)
MCHC: 33 g/dL (ref 30.0–36.0)
MCV: 84.6 fl (ref 78.0–100.0)
Platelets: 242 10*3/uL (ref 150.0–400.0)
RBC: 4.98 Mil/uL (ref 3.87–5.11)
RDW: 14.7 % (ref 11.5–15.5)
WBC: 7.8 10*3/uL (ref 4.0–10.5)

## 2024-04-01 LAB — HEMOGLOBIN A1C: Hgb A1c MFr Bld: 5.8 % (ref 4.6–6.5)

## 2024-04-01 LAB — LIPID PANEL
Cholesterol: 192 mg/dL (ref 0–200)
HDL: 40 mg/dL (ref >39.00)
LDL Cholesterol: 134 mg/dL — ABNORMAL HIGH (ref 0–99)
NonHDL: 151.68
Total CHOL/HDL Ratio: 5
Triglycerides: 86 mg/dL (ref 0.0–149.0)
VLDL: 17.2 mg/dL (ref 0.0–40.0)

## 2024-04-01 LAB — COMPREHENSIVE METABOLIC PANEL WITH GFR
ALT: 11 U/L (ref 0–35)
AST: 17 U/L (ref 0–37)
Albumin: 4.1 g/dL (ref 3.5–5.2)
Alkaline Phosphatase: 49 U/L (ref 39–117)
BUN: 12 mg/dL (ref 6–23)
CO2: 24 meq/L (ref 19–32)
Calcium: 9 mg/dL (ref 8.4–10.5)
Chloride: 104 meq/L (ref 96–112)
Creatinine, Ser: 0.53 mg/dL (ref 0.40–1.20)
GFR: 109.79 mL/min (ref >60.00)
Glucose, Bld: 81 mg/dL (ref 70–99)
Potassium: 3.8 meq/L (ref 3.5–5.1)
Sodium: 136 meq/L (ref 135–145)
Total Bilirubin: 0.7 mg/dL (ref 0.2–1.2)
Total Protein: 8.2 g/dL (ref 6.0–8.3)

## 2024-04-01 LAB — T4, FREE: Free T4: 0.8 ng/dL (ref 0.60–1.60)

## 2024-04-01 LAB — TSH: TSH: 3.9 u[IU]/mL (ref 0.35–5.50)

## 2024-04-01 NOTE — Progress Notes (Signed)
 Chief Complaint:  [name] is a 48 y.o. female who presents today for her annual comprehensive physical exam.    Assessment/Plan:  Chronic Problems Addressed Today: Prediabetes Recently started tirzepatide .  She is now on 10 mg weekly.  Tolerating well.  Check A1c today.  Morbid obesity (HCC) She is down about 22 pounds since starting tirzepatide .  She is also working diet and exercise.  She will continue management per medical weight management.  Congratulated patient on weight loss today.  Preventative Healthcare: Check labs. UpToDate on colon cancer screening. UpToDate on vaccines.   Patient Counseling(The following topics were reviewed and/or handout was given):  -Nutrition: Stressed importance of moderation in sodium/caffeine intake, saturated fat and cholesterol, caloric balance, sufficient intake of fresh fruits, vegetables, and fiber.  -Stressed the importance of regular exercise.   -Substance Abuse: Discussed cessation/primary prevention of tobacco, alcohol, or other drug use; driving or other dangerous activities under the influence; availability of treatment for abuse.   -Injury prevention: Discussed safety belts, safety helmets, smoke detector, smoking near bedding or upholstery.   -Sexuality: Discussed sexually transmitted diseases, partner selection, use of condoms, avoidance of unintended pregnancy and contraceptive alternatives.   -Dental health: Discussed importance of regular tooth brushing, flossing, and dental visits.  -Health maintenance and immunizations reviewed. Please refer to Health maintenance section.  Return to care in 1 year for next preventative visit.     Subjective:  HPI:  She has no acute complaints today. See assessment / plan for status of chronic conditions.  She is here today for an annual physical.  Since her last visit she has established with weight loss clinic and has been started on tirzepatide .  She is doing well with this.     Lifestyle Diet: Balanced. Plenty of fruits and vegetables.  Exercise: Very active at work and getting steps in.      04/01/2024    9:27 AM  Depression screen PHQ 2/9  Decreased Interest 0  Down, Depressed, Hopeless 0  PHQ - 2 Score 0    Health Maintenance Due  Topic Date Due   Hepatitis B Vaccines (1 of 3 - 19+ 3-dose series) Never done     ROS: Per HPI, otherwise a complete review of systems was negative.   PMH:  The following were reviewed and entered/updated in epic: Past Medical History:  Diagnosis Date   Preterm labor    Patient Active Problem List   Diagnosis Date Noted   Prediabetes 12/30/2021   Morbid obesity (HCC) 12/30/2021   Past Surgical History:  Procedure Laterality Date   CESAREAN SECTION     CYST EXCISION      Family History  Problem Relation Age of Onset   Hypertension Mother    Colon cancer Father    Cancer Father        colon cancer   Diabetes Father    Colon polyps Sister    Diabetes Sister    Gout Sister    Esophageal cancer Neg Hx    Rectal cancer Neg Hx    Stomach cancer Neg Hx     Medications- reviewed and updated Current Outpatient Medications  Medication Sig Dispense Refill   Multiple Vitamin (MULTIVITAMIN) tablet Take 1 tablet by mouth daily.     TIRZEPATIDE       No current facility-administered medications for this visit.    Allergies-reviewed and updated Allergies  Allergen Reactions   Diflucan [Fluconazole]     Rash     Social History  Socioeconomic History   Marital status: Married    Spouse name: Not on file   Number of children: Not on file   Years of education: Not on file   Highest education level: Associate degree: academic program  Occupational History   Not on file  Tobacco Use   Smoking status: Never   Smokeless tobacco: Never  Substance and Sexual Activity   Alcohol use: Yes    Comment: occasional   Drug use: No   Sexual activity: Yes    Birth control/protection: None  Other Topics  Concern   Not on file  Social History Narrative   Not on file   Social Drivers of Health   Financial Resource Strain: Low Risk  (03/29/2024)   Overall Financial Resource Strain (CARDIA)    Difficulty of Paying Living Expenses: Not hard at all  Food Insecurity: No Food Insecurity (03/29/2024)   Hunger Vital Sign    Worried About Running Out of Food in the Last Year: Never true    Ran Out of Food in the Last Year: Never true  Transportation Needs: No Transportation Needs (03/29/2024)   PRAPARE - Administrator, Civil Service (Medical): No    Lack of Transportation (Non-Medical): No  Physical Activity: Sufficiently Active (03/29/2024)   Exercise Vital Sign    Days of Exercise per Week: 5 days    Minutes of Exercise per Session: 60 min  Stress: No Stress Concern Present (03/29/2024)   [employer] of Occupational Health - Occupational Stress Questionnaire    Feeling of Stress: Not at all  Social Connections: Moderately Isolated (03/29/2024)   Social Connection and Isolation Panel    Frequency of Communication with Friends and Family: More than three times a week    Frequency of Social Gatherings with Friends and Family: Once a week    Attends Religious Services: Never    Database administrator or Organizations: No    Attends Engineer, structural: Not on file    Marital Status: Married        Objective:  Physical Exam: BP 118/85   Pulse 82   Temp 97.7 F (36.5 C) (Temporal)   Ht 4' 11 (1.499 m)   Wt 231 lb 6.4 oz (105 kg)   LMP 02/04/2024   SpO2 98%   BMI 46.74 kg/m   Body mass index is 46.74 kg/m. Wt Readings from Last 3 Encounters:  04/01/24 231 lb 6.4 oz (105 kg)  08/13/23 253 lb 3.2 oz (114.9 kg)  07/17/23 249 lb 2 oz (113 kg)   Gen: NAD, resting comfortably HEENT: TMs normal bilaterally. OP clear. No thyromegaly noted.  CV: RRR with no murmurs appreciated Pulm: NWOB, CTAB with no crackles, wheezes, or rhonchi GI: Normal bowel sounds  present. Soft, Nontender, Nondistended. MSK: no edema, cyanosis, or clubbing noted Skin: warm, dry Neuro: CN2-12 grossly intact. Strength 5/5 in upper and lower extremities. Reflexes symmetric and intact bilaterally.  Psych: Normal affect and thought content     Mandy Peeks M. Kennyth, MD 04/01/2024 10:02 AM

## 2024-04-01 NOTE — Patient Instructions (Signed)
 It was very nice to see you today!  I am glad that you are doing well!  Will check blood work today.  Please continue to work on diet and exercise.  Will see you back in year for your next physical.  Please come back to see us  sooner if needed.  Return in about 1 year (around 04/01/2025) for Annual Physical.   Take care, Dr Kennyth  PLEASE NOTE:  If you had any lab tests, please let us  know if you have not heard back within a few days. You may see your results on mychart before we have a chance to review them but we will give you a call once they are reviewed by us .   If we ordered any referrals today, please let us  know if you have not heard from their office within the next week.   If you had any urgent prescriptions sent in today, please check with the pharmacy within an hour of our visit to make sure the prescription was transmitted appropriately.   Please try these tips to maintain a healthy lifestyle:  Eat at least 3 REAL meals and 1-2 snacks per day.  Aim for no more than 5 hours between eating.  If you eat breakfast, please do so within one hour of getting up.   Each meal should contain half fruits/vegetables, one quarter protein, and one quarter carbs (no bigger than a computer mouse)  Cut down on sweet beverages. This includes juice, soda, and sweet tea.   Drink at least 1 glass of water with each meal and aim for at least 8 glasses per day  Exercise at least 150 minutes every week.    Preventive Care 59-67 Years Old, Female Preventive care refers to lifestyle choices and visits with your health care provider that can promote health and wellness. Preventive care visits are also called wellness exams. What can I expect for my preventive care visit? Counseling Your health care provider may ask you questions about your: Medical history, including: Past medical problems. Family medical history. Pregnancy history. Current health, including: Menstrual cycle. Method of  birth control. Emotional well-being. Home life and relationship well-being. Sexual activity and sexual health. Lifestyle, including: Alcohol, nicotine or tobacco, and drug use. Access to firearms. Diet, exercise, and sleep habits. Work and work Astronomer. Sunscreen use. Safety issues such as seatbelt and bike helmet use. Physical exam Your health care provider will check your: Height and weight. These may be used to calculate your BMI (body mass index). BMI is a measurement that tells if you are at a healthy weight. Waist circumference. This measures the distance around your waistline. This measurement also tells if you are at a healthy weight and may help predict your risk of certain diseases, such as type 2 diabetes and high blood pressure. Heart rate and blood pressure. Body temperature. Skin for abnormal spots. What immunizations do I need?  Vaccines are usually given at various ages, according to a schedule. Your health care provider will recommend vaccines for you based on your age, medical history, and lifestyle or other factors, such as travel or where you work. What tests do I need? Screening Your health care provider may recommend screening tests for certain conditions. This may include: Lipid and cholesterol levels. Diabetes screening. This is done by checking your blood sugar (glucose) after you have not eaten for a while (fasting). Pelvic exam and Pap test. Hepatitis B test. Hepatitis C test. HIV (human immunodeficiency virus) test. STI (sexually transmitted infection)  testing, if you are at risk. Lung cancer screening. Colorectal cancer screening. Mammogram. Talk with your health care provider about when you should start having regular mammograms. This may depend on whether you have a family history of breast cancer. BRCA-related cancer screening. This may be done if you have a family history of breast, ovarian, tubal, or peritoneal cancers. Bone density scan. This  is done to screen for osteoporosis. Talk with your health care provider about your test results, treatment options, and if necessary, the need for more tests. Follow these instructions at home: Eating and drinking  Eat a diet that includes fresh fruits and vegetables, whole grains, lean protein, and low-fat dairy products. Take vitamin and mineral supplements as recommended by your health care provider. Do not drink alcohol if: Your health care provider tells you not to drink. You are pregnant, may be pregnant, or are planning to become pregnant. If you drink alcohol: Limit how much you have to 0-1 drink a day. Know how much alcohol is in your drink. In the [place] one drink equals one 12 oz bottle of beer (355 mL), one 5 oz glass of wine (148 mL), or one 1 oz glass of hard liquor (44 mL). Lifestyle Brush your teeth every morning and night with fluoride toothpaste. Floss one time each day. Exercise for at least 30 minutes 5 or more days each week. Do not use any products that contain nicotine or tobacco. These products include cigarettes, chewing tobacco, and vaping devices, such as e-cigarettes. If you need help quitting, ask your health care provider. Do not use drugs. If you are sexually active, practice safe sex. Use a condom or other form of protection to prevent STIs. If you do not wish to become pregnant, use a form of birth control. If you plan to become pregnant, see your health care provider for a prepregnancy visit. Take aspirin only as told by your health care provider. Make sure that you understand how much to take and what form to take. Work with your health care provider to find out whether it is safe and beneficial for you to take aspirin daily. Find healthy ways to manage stress, such as: Meditation, yoga, or listening to music. Journaling. Talking to a trusted person. Spending time with friends and family. Minimize exposure to UV radiation to reduce your risk of skin  cancer. Safety Always wear your seat belt while driving or riding in a vehicle. Do not drive: If you have been drinking alcohol. Do not ride with someone who has been drinking. When you are tired or distracted. While texting. If you have been using any mind-altering substances or drugs. Wear a helmet and other protective equipment during sports activities. If you have firearms in your house, make sure you follow all gun safety procedures. Seek help if you have been physically or sexually abused. What's next? Visit your health care provider once a year for an annual wellness visit. Ask your health care provider how often you should have your eyes and teeth checked. Stay up to date on all vaccines. This information is not intended to replace advice given to you by your health care provider. Make sure you discuss any questions you have with your health care provider. Document Revised: 03/20/2021 Document Reviewed: 03/20/2021 Elsevier Patient Education  2024 [employer].

## 2024-04-01 NOTE — Assessment & Plan Note (Signed)
 She is down about 22 pounds since starting tirzepatide .  She is also working diet and exercise.  She will continue management per medical weight management.  Congratulated patient on weight loss today.

## 2024-04-01 NOTE — Assessment & Plan Note (Signed)
 Recently started tirzepatide .  She is now on 10 mg weekly.  Tolerating well.  Check A1c today.

## 2024-04-02 LAB — HIV ANTIBODY (ROUTINE TESTING W REFLEX): HIV 1&2 Ab, 4th Generation: NONREACTIVE

## 2024-04-02 LAB — HEPATITIS C ANTIBODY: Hepatitis C Ab: NONREACTIVE

## 2024-04-04 ENCOUNTER — Ambulatory Visit: Payer: Self-pay | Admitting: Family Medicine

## 2024-04-04 NOTE — Progress Notes (Signed)
 Her cholesterol is borderline elevated.  She should keep up the great work with diet and exercise and we can recheck this again in a year or so.  Her A1c is improved to 5.8.  This should she continue to improve with weight loss.  We can recheck again in a year or so.  All of her other labs are at goal.

## 2024-05-18 ENCOUNTER — Encounter: Payer: Self-pay | Admitting: Family

## 2024-05-18 ENCOUNTER — Ambulatory Visit: Admitting: Family

## 2024-05-18 VITALS — BP 117/82 | HR 84 | Temp 97.9°F | Ht 59.0 in | Wt 226.5 lb

## 2024-05-18 DIAGNOSIS — M79671 Pain in right foot: Secondary | ICD-10-CM | POA: Diagnosis not present

## 2024-05-18 DIAGNOSIS — M5412 Radiculopathy, cervical region: Secondary | ICD-10-CM | POA: Insufficient documentation

## 2024-05-18 NOTE — Progress Notes (Signed)
 Patient ID: [name], female    DOB: May 24, 1976, 48 y.o.   MRN: [id number]  Chief Complaint  Patient presents with   Foot Pain    Pt c/o right heel foot pain, Present for 2 weeks. Has tried tylenol and massager which did not help.   Discussed the use of AI scribe software for clinical note transcription with the patient, who gave verbal consent to proceed.  History of Present Illness [name] is a 48 year old female who presents with right heel pain.  Right heel pain - Significant right plantar heel pain for two weeks - Pain intensifies with pressure, especially when standing, walking, or getting out of bed in the morning - Pain localized to the heel without radiation - Mild pins and needles sensations present at times - Previous similar episode in the same foot attributed to a heel spur identified via x-ray, which resolved without intervention  Pain management and supportive measures - Uses massage gun and shiatsu foot massager with some benefit - Takes Tylenol for pain relief with some benefit  Occupational and footwear factors - Works in [employer] requiring prolonged standing & walking - Wears supportive Skechers shoes with cushioning - Avoids high heels - Does not have a high arch  Assessment & Plan Right heel pain due to plantar fasciitis Pain consistent with plantar fasciitis, started over the past two weeks. Symptoms align with plantar fasciitis, with severe pain upon waking and after rest. Conservative management recommended to reduce inflammation and provide support. - Advise icing heel with frozen water bottle, rolling under heel for 15-20 minutes multiple times daily. - Recommend wearing supportive, cushioned footwear at all times, including indoors, no barefoot walking. - Suggest replacing shoe insoles with Dr Heriberto plantar fasciitis or similar cushion insoles. - Instruct on calf stretches and ankle pumps to alleviate calf muscle  tightness. - Advise elevating feet during rest to reduce swelling. - Provide printed educational material on plantar fasciitis management. - Recommend podiatrist (seen last year) follow-up if no improvement after a few weeks.  Subjective:    Outpatient Medications Prior to Visit  Medication Sig Dispense Refill   Multiple Vitamin (MULTIVITAMIN) tablet Take 1 tablet by mouth daily.     TIRZEPATIDE       No facility-administered medications prior to visit.   Past Medical History:  Diagnosis Date   Cervical radiculopathy 05/18/2024   Preterm labor    Past Surgical History:  Procedure Laterality Date   CESAREAN SECTION     CYST EXCISION     Allergies  Allergen Reactions   Diflucan [Fluconazole]     Rash       Objective:    Physical Exam Vitals and nursing note reviewed.  Constitutional:      Appearance: Normal appearance. She is obese.  Cardiovascular:     Rate and Rhythm: Normal rate and regular rhythm.  Pulmonary:     Effort: Pulmonary effort is normal.     Breath sounds: Normal breath sounds.  Musculoskeletal:        General: Normal range of motion.     Right foot: Normal range of motion.       Feet:  Feet:     Right foot:     Skin integrity: Skin integrity normal. No blister, skin breakdown, erythema or warmth.     Comments: mild pain w/palpation on plantar heel Skin:    General: Skin is warm and dry.  Neurological:     Mental Status: She is  alert.  Psychiatric:        Mood and Affect: Mood normal.        Behavior: Behavior normal.    BP 117/82 (BP Location: Left Arm, Patient Position: Sitting, Cuff Size: Large)   Pulse 84   Temp 97.9 F (36.6 C) (Temporal)   Ht 4' 11 (1.499 m)   Wt 226 lb 8 oz (102.7 kg)   LMP 05/02/2024 (Exact Date)   SpO2 96%   BMI 45.75 kg/m  Wt Readings from Last 3 Encounters:  05/18/24 226 lb 8 oz (102.7 kg)  04/01/24 231 lb 6.4 oz (105 kg)  08/13/23 253 lb 3.2 oz (114.9 kg)      Lucius Krabbe, NP

## 2024-05-19 ENCOUNTER — Ambulatory Visit: Admitting: Family

## 2024-09-12 LAB — HM MAMMOGRAPHY

## 2025-04-06 ENCOUNTER — Encounter: Admitting: Family Medicine
